# Patient Record
Sex: Female | Born: 1965 | Race: Black or African American | Hispanic: No | Marital: Single | Smoking: Never smoker
Health system: Southern US, Community
[De-identification: ages and names within clinical notes are randomized; demographics above are authoritative.]

## PROBLEM LIST (undated history)

## (undated) ENCOUNTER — Ambulatory Visit (HOSPITAL_COMMUNITY): Admission: EM | Payer: Managed Care, Other (non HMO) | Source: Home / Self Care

## (undated) DIAGNOSIS — I10 Essential (primary) hypertension: Secondary | ICD-10-CM

## (undated) DIAGNOSIS — F419 Anxiety disorder, unspecified: Secondary | ICD-10-CM

## (undated) DIAGNOSIS — N189 Chronic kidney disease, unspecified: Secondary | ICD-10-CM

## (undated) DIAGNOSIS — Z87442 Personal history of urinary calculi: Secondary | ICD-10-CM

## (undated) DIAGNOSIS — N23 Unspecified renal colic: Secondary | ICD-10-CM

## (undated) DIAGNOSIS — K219 Gastro-esophageal reflux disease without esophagitis: Secondary | ICD-10-CM

## (undated) DIAGNOSIS — I499 Cardiac arrhythmia, unspecified: Secondary | ICD-10-CM

## (undated) DIAGNOSIS — R030 Elevated blood-pressure reading, without diagnosis of hypertension: Secondary | ICD-10-CM

## (undated) DIAGNOSIS — T7840XA Allergy, unspecified, initial encounter: Secondary | ICD-10-CM

## (undated) HISTORY — DX: Elevated blood-pressure reading, without diagnosis of hypertension: R03.0

## (undated) HISTORY — DX: Personal history of urinary calculi: Z87.442

## (undated) HISTORY — DX: Anxiety disorder, unspecified: F41.9

## (undated) HISTORY — DX: Unspecified renal colic: N23

## (undated) HISTORY — DX: Cardiac arrhythmia, unspecified: I49.9

## (undated) HISTORY — DX: Gastro-esophageal reflux disease without esophagitis: K21.9

## (undated) HISTORY — PX: GYNECOLOGIC CRYOSURGERY: SHX857

## (undated) HISTORY — PX: DENTAL SURGERY: SHX609

## (undated) HISTORY — DX: Essential (primary) hypertension: I10

## (undated) HISTORY — DX: Allergy, unspecified, initial encounter: T78.40XA

## (undated) HISTORY — PX: COLPOSCOPY: SHX161

---
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
## 2001-10-26 ENCOUNTER — Other Ambulatory Visit: Admission: RE | Admit: 2001-10-26 | Discharge: 2001-10-26 | Payer: Self-pay | Admitting: Obstetrics and Gynecology

## 2002-11-05 ENCOUNTER — Other Ambulatory Visit: Admission: RE | Admit: 2002-11-05 | Discharge: 2002-11-05 | Payer: Self-pay | Admitting: Obstetrics and Gynecology

## 2003-11-06 ENCOUNTER — Other Ambulatory Visit: Admission: RE | Admit: 2003-11-06 | Discharge: 2003-11-06 | Payer: Self-pay | Admitting: Obstetrics and Gynecology

## 2004-09-21 ENCOUNTER — Ambulatory Visit: Payer: Self-pay | Admitting: Internal Medicine

## 2004-09-28 ENCOUNTER — Ambulatory Visit: Payer: Self-pay | Admitting: Internal Medicine

## 2004-11-12 ENCOUNTER — Other Ambulatory Visit: Admission: RE | Admit: 2004-11-12 | Discharge: 2004-11-12 | Payer: Self-pay | Admitting: Obstetrics and Gynecology

## 2005-11-18 ENCOUNTER — Other Ambulatory Visit: Admission: RE | Admit: 2005-11-18 | Discharge: 2005-11-18 | Payer: Self-pay | Admitting: Obstetrics and Gynecology

## 2006-03-17 ENCOUNTER — Ambulatory Visit: Payer: Self-pay | Admitting: Internal Medicine

## 2006-10-19 DIAGNOSIS — F329 Major depressive disorder, single episode, unspecified: Secondary | ICD-10-CM | POA: Insufficient documentation

## 2006-10-19 DIAGNOSIS — F3289 Other specified depressive episodes: Secondary | ICD-10-CM | POA: Insufficient documentation

## 2006-12-08 ENCOUNTER — Other Ambulatory Visit: Admission: RE | Admit: 2006-12-08 | Discharge: 2006-12-08 | Payer: Self-pay | Admitting: Obstetrics and Gynecology

## 2007-01-10 ENCOUNTER — Telehealth: Payer: Self-pay | Admitting: Internal Medicine

## 2007-12-21 ENCOUNTER — Ambulatory Visit: Payer: Self-pay | Admitting: Internal Medicine

## 2007-12-21 DIAGNOSIS — Z87442 Personal history of urinary calculi: Secondary | ICD-10-CM | POA: Insufficient documentation

## 2007-12-21 DIAGNOSIS — N23 Unspecified renal colic: Secondary | ICD-10-CM | POA: Insufficient documentation

## 2007-12-21 HISTORY — DX: Personal history of urinary calculi: Z87.442

## 2007-12-21 LAB — CONVERTED CEMR LAB
Ketones, urine, test strip: NEGATIVE
Nitrite: NEGATIVE
Urobilinogen, UA: NEGATIVE
WBC Urine, dipstick: NEGATIVE

## 2008-01-01 ENCOUNTER — Other Ambulatory Visit: Admission: RE | Admit: 2008-01-01 | Discharge: 2008-01-01 | Payer: Self-pay | Admitting: Obstetrics and Gynecology

## 2008-01-01 ENCOUNTER — Ambulatory Visit: Payer: Self-pay | Admitting: Obstetrics and Gynecology

## 2008-01-01 ENCOUNTER — Encounter: Payer: Self-pay | Admitting: Obstetrics and Gynecology

## 2008-07-17 ENCOUNTER — Ambulatory Visit: Payer: Self-pay | Admitting: Internal Medicine

## 2008-07-17 LAB — CONVERTED CEMR LAB
ALT: 14 units/L (ref 0–35)
BUN: 10 mg/dL (ref 6–23)
Bilirubin Urine: NEGATIVE
Bilirubin, Direct: 0 mg/dL (ref 0.0–0.3)
Calcium: 9 mg/dL (ref 8.4–10.5)
Chloride: 110 meq/L (ref 96–112)
Cholesterol: 149 mg/dL (ref 0–200)
Creatinine, Ser: 0.5 mg/dL (ref 0.4–1.2)
Eosinophils Relative: 0.8 % (ref 0.0–5.0)
GFR calc non Af Amer: 175.27 mL/min (ref >60)
HDL: 51.3 mg/dL (ref >39.00)
Ketones, urine, test strip: NEGATIVE
LDL Cholesterol: 81 mg/dL (ref 0–99)
MCV: 86.5 fL (ref 78.0–100.0)
Monocytes Absolute: 0.2 10*3/uL (ref 0.1–1.0)
Neutrophils Relative %: 68.5 % (ref 43.0–77.0)
Platelets: 337 10*3/uL (ref 150.0–400.0)
Protein, U semiquant: NEGATIVE
Total Bilirubin: 0.5 mg/dL (ref 0.3–1.2)
Triglycerides: 86 mg/dL (ref 0.0–149.0)
Urobilinogen, UA: 0.2
VLDL: 17.2 mg/dL (ref 0.0–40.0)
WBC: 8.6 10*3/uL (ref 4.5–10.5)

## 2008-07-29 ENCOUNTER — Ambulatory Visit: Payer: Self-pay | Admitting: Internal Medicine

## 2008-12-23 ENCOUNTER — Encounter: Admission: RE | Admit: 2008-12-23 | Discharge: 2008-12-23 | Payer: Self-pay | Admitting: Obstetrics and Gynecology

## 2009-01-06 ENCOUNTER — Other Ambulatory Visit: Admission: RE | Admit: 2009-01-06 | Discharge: 2009-01-06 | Payer: Self-pay | Admitting: Obstetrics and Gynecology

## 2009-01-06 ENCOUNTER — Ambulatory Visit: Payer: Self-pay | Admitting: Obstetrics and Gynecology

## 2009-01-06 ENCOUNTER — Encounter: Payer: Self-pay | Admitting: Obstetrics and Gynecology

## 2009-10-14 ENCOUNTER — Ambulatory Visit: Payer: Self-pay | Admitting: Family Medicine

## 2009-10-14 DIAGNOSIS — R300 Dysuria: Secondary | ICD-10-CM | POA: Insufficient documentation

## 2009-10-14 DIAGNOSIS — R030 Elevated blood-pressure reading, without diagnosis of hypertension: Secondary | ICD-10-CM

## 2009-10-14 HISTORY — DX: Elevated blood-pressure reading, without diagnosis of hypertension: R03.0

## 2009-10-14 LAB — CONVERTED CEMR LAB
Bilirubin Urine: NEGATIVE
Ketones, urine, test strip: NEGATIVE

## 2009-10-28 ENCOUNTER — Ambulatory Visit: Payer: Self-pay | Admitting: Internal Medicine

## 2009-10-28 DIAGNOSIS — I10 Essential (primary) hypertension: Secondary | ICD-10-CM | POA: Insufficient documentation

## 2010-01-06 ENCOUNTER — Encounter: Admission: RE | Admit: 2010-01-06 | Discharge: 2010-01-06 | Payer: Self-pay | Admitting: Obstetrics and Gynecology

## 2010-04-07 NOTE — Assessment & Plan Note (Signed)
Summary: ?uti/discomfort when urinating/cjr   Vital Signs:  Patient profile:   45 year old female Height:      64.5 inches Weight:      233 pounds BMI:     39.52 Temp:     98.7 degrees F oral BP sitting:   132 / 100  (left arm) Cuff size:   regular  Vitals Entered By: Kern Reap CMA Duncan Dull) (October 14, 2009 3:39 PM) CC: uti Is Patient Diabetic? No   CC:  uti.  History of Present Illness: Tanya Richard is a 45 year old female, who comes in today for evaluation of two problems.  Five days ago she began having dysuria.  She comes in today, because he's been drinking lots of water, and it won't go away.  Her last UTI was 3 years ago.  She's had no fever, chills, or back pain.  She is on BCPs.  Her occupation is since she sits for 6 to 7 hours at a time and does not get up to empty her bladder.  A BP today by me 170 over hundred right arm sitting position.  I wish that she had a hurry to get here and time and this ended the day and is a lot of stress at work.  No family history of hypertension.  Allergies: No Known Drug Allergies  Family History: Reviewed history from 07/29/2008 and no changes required. Family History of Arthritis Family History Diabetes 1st degree relative Family History Hypertension Mother Age 69  IGT Father Age 36- healthy  2 half -sisters  Social History: Reviewed history from 07/29/2008 and no changes required. Single Never Smoked Alcohol use-no Drug use-no Regular exercise-yes  Review of Systems      See HPI  Physical Exam  General:  Well-developed,well-nourished,in no acute distress; alert,appropriate and cooperative throughout examination Heart:  170 over hundred right arm sitting position Abdomen:  Bowel sounds positive,abdomen soft and non-tender without masses, organomegaly or hernias noted.   Impression & Recommendations:  Problem # 1:  DYSURIA (ICD-788.1) Assessment New  Orders: UA Dipstick w/o Micro (manual) (46962) Prescription  Created Electronically [phone])  Her updated medication list for this problem includes:    Septra Ds 800-160 Mg Tabs (Sulfamethoxazole-trimethoprim) .Marland Kitchen... Take 1 tablet by mouth two times a day  Problem # 2:  ELEVATED BP READING WITHOUT DX HYPERTENSION (ICD-796.2) Assessment: New  Orders: Prescription Created Electronically [phone])  Complete Medication List: 1)  Ortho Tri-cyclen (28) 0.035 Mg Tabs (Norgestimate-ethinyl estradiol) .Marland Kitchen.. 1 once daily 2)  Doxycycline  3)  Daily Value Multivitamin Tabs (Multiple vitamin) .... Once daily 4)  Naproxen Sodium 220 Mg Tabs (Naproxen sodium) .... As needed 5)  Septra Ds 800-160 Mg Tabs (Sulfamethoxazole-trimethoprim) .... Take 1 tablet by mouth two times a day  Patient Instructions: 1)  drink 30 ounces of water daily and be sure to empty her bladder every 3 hours during the day.  Begin Septra, one twice a day for 10 days. 2)  Purchase.  A digital  pressure cuff, check the BP morning, and return to see Dr. Kirtland Bouchard  in two weeks for follow-up with the data and the device Prescriptions: SEPTRA DS 800-160 MG TABS (SULFAMETHOXAZOLE-TRIMETHOPRIM) Take 1 tablet by mouth two times a day  #20 x 1   Entered and Authorized by:   Roderick Pee MD   Signed by:   Roderick Pee MD on 10/14/2009   Method used:   Electronically to        [id number][facility] (retail)       [address] [place], [place]  [address]       Ph: [phone]       Fax: [phone]   RxID:   [id number]   Laboratory Results   Urine Tests  Date/Time Received: October 14, 2009   Routine Urinalysis   Color: yellow Appearance: Cloudy Glucose: negative   (Normal Range: Negative) Bilirubin: negative   (Normal Range: Negative) Ketone: negative   (Normal Range: Negative) Spec. Gravity: 1.015   (Normal Range: 1.003-1.035) Blood: moderate ++   (Normal Range: Negative) pH: 6.5   (Normal Range: 5.0-8.0) Protein: trace   (Normal Range: Negative) Urobilinogen: 0.2    (Normal Range: 0-1) Nitrite: negative   (Normal Range: Negative) Leukocyte Esterace: moderate ++   (Normal Range: Negative)    Comments: Kathrynn Speed CMA  October 14, 2009 3:55 PM

## 2010-04-07 NOTE — Assessment & Plan Note (Signed)
Summary: 2 wk rov/njr   Vital Signs:  Patient profile:   45 year old female Weight:      234 pounds BP sitting:   140 / 100  (left arm) Cuff size:   large  Vitals Entered By: Kathrynn Speed CMA (October 28, 2009 4:29 PM) CC: 2 week fu BP, src Is Patient Diabetic? No   CC:  2 week fu BP and src.  History of Present Illness: 45 year old patient who is seen today for follow-up of hypertension.  She was seen two weeks ago and treated for a UTI.  These  symptoms have resolved.  she was noted to have stage II hypertension at that time.  She has a monitoring home blood pressure readings that have been consistently high.  She was last seen here in the spring and was normotensive at that time.  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 1 Once Daily 2)  Doxycycline 3)  Daily Value Multivitamin  Tabs (Multiple Vitamin) .... Once Daily 4)  Naproxen Sodium 220 Mg Tabs (Naproxen Sodium) .... As Needed 5)  Septra Ds 800-160 Mg Tabs (Sulfamethoxazole-Trimethoprim) .... Take 1 Tablet By Mouth Two Times A Day  Allergies (verified): No Known Drug Allergies  Past History:  Past Medical History: Depression- situational Vertigo Nephrolithiasis, hx of Hypertension  Physical Exam  General:  overweight-appearing.  140/100   Impression & Recommendations:  Problem # 1:  HYPERTENSION (ICD-401.9)  Her updated medication list for this problem includes:    Lisinopril-hydrochlorothiazide 20-25 Mg Tabs (Lisinopril-hydrochlorothiazide) ..... One daily  Complete Medication List: 1)  Ortho Tri-cyclen (28) 0.035 Mg Tabs (Norgestimate-ethinyl estradiol) .Marland Kitchen.. 1 once daily 2)  Doxycycline  3)  Daily Value Multivitamin Tabs (Multiple vitamin) .... Once daily 4)  Naproxen Sodium 220 Mg Tabs (Naproxen sodium) .... As needed 5)  Septra Ds 800-160 Mg Tabs (Sulfamethoxazole-trimethoprim) .... Take 1 tablet by  mouth two times a day 6)  Lisinopril-hydrochlorothiazide 20-25 Mg Tabs (Lisinopril-hydrochlorothiazide) .... One daily  Patient Instructions: 1)  Limit your Sodium (Salt). 2)  It is important that you exercise regularly at least 20 minutes 5 times a week. If you develop chest pain, have severe difficulty breathing, or feel very tired , stop exercising immediately and seek medical attention. 3)  You need to lose weight. Consider a lower calorie diet and regular exercise.  4)  Check your Blood Pressure regularly.  5)  Please schedule a follow-up appointment in 1 month. Prescriptions: LISINOPRIL-HYDROCHLOROTHIAZIDE 20-25 MG TABS (LISINOPRIL-HYDROCHLOROTHIAZIDE) one daily  #90 x 0   Entered and Authorized by:   Gordy Savers  MD   Signed by:   Gordy Savers  MD on 10/28/2009   Method used:   Electronically to        [facility] [phone]* (retail)       [address] [place], [place]  [address]       Ph: [phone]       Fax: [phone]   RxID:   [id number]

## 2010-04-30 ENCOUNTER — Other Ambulatory Visit: Payer: Self-pay | Admitting: Obstetrics and Gynecology

## 2010-04-30 ENCOUNTER — Encounter (INDEPENDENT_AMBULATORY_CARE_PROVIDER_SITE_OTHER): Payer: Managed Care, Other (non HMO) | Admitting: Obstetrics and Gynecology

## 2010-04-30 ENCOUNTER — Other Ambulatory Visit (HOSPITAL_COMMUNITY)
Admission: RE | Admit: 2010-04-30 | Discharge: 2010-04-30 | Disposition: A | Discharge status: Home / Self Care | Payer: Managed Care, Other (non HMO) | Source: Ambulatory Visit | Attending: Obstetrics and Gynecology | Admitting: Obstetrics and Gynecology

## 2010-04-30 DIAGNOSIS — R823 Hemoglobinuria: Secondary | ICD-10-CM

## 2010-04-30 DIAGNOSIS — Z124 Encounter for screening for malignant neoplasm of cervix: Secondary | ICD-10-CM | POA: Insufficient documentation

## 2010-04-30 DIAGNOSIS — Z01419 Encounter for gynecological examination (general) (routine) without abnormal findings: Secondary | ICD-10-CM

## 2010-11-05 ENCOUNTER — Other Ambulatory Visit (INDEPENDENT_AMBULATORY_CARE_PROVIDER_SITE_OTHER): Payer: Managed Care, Other (non HMO)

## 2010-11-05 DIAGNOSIS — Z0000 Encounter for general adult medical examination without abnormal findings: Secondary | ICD-10-CM

## 2010-11-05 LAB — CBC WITH DIFFERENTIAL/PLATELET
Basophils Relative: 0.4 % (ref 0.0–3.0)
Eosinophils Absolute: 0.1 10*3/uL (ref 0.0–0.7)
HCT: 37 % (ref 36.0–46.0)
Hemoglobin: 12.1 g/dL (ref 12.0–15.0)
MCHC: 32.7 g/dL (ref 30.0–36.0)
MCV: 88.2 fl (ref 78.0–100.0)
Monocytes Absolute: 0.3 10*3/uL (ref 0.1–1.0)
Neutro Abs: 5.6 10*3/uL (ref 1.4–7.7)
RBC: 4.2 Mil/uL (ref 3.87–5.11)

## 2010-11-05 LAB — HEPATIC FUNCTION PANEL
ALT: 14 U/L (ref 0–35)
Albumin: 3.6 g/dL (ref 3.5–5.2)
Alkaline Phosphatase: 103 U/L (ref 39–117)
Total Protein: 7.6 g/dL (ref 6.0–8.3)

## 2010-11-05 LAB — LIPID PANEL
HDL: 63.6 mg/dL (ref >39.00)
Triglycerides: 56 mg/dL (ref 0.0–149.0)

## 2010-11-05 LAB — POCT URINALYSIS DIPSTICK
Glucose, UA: NEGATIVE
pH, UA: 6

## 2010-11-05 LAB — T4, FREE: Free T4: 1.11 ng/dL (ref 0.60–1.60)

## 2010-11-05 LAB — BASIC METABOLIC PANEL
CO2: 27 mEq/L (ref 19–32)
Chloride: 105 mEq/L (ref 96–112)
Potassium: 3.5 mEq/L (ref 3.5–5.1)
Sodium: 141 mEq/L (ref 135–145)

## 2010-11-05 LAB — T3, FREE: T3, Free: 3.1 pg/mL (ref 2.3–4.2)

## 2010-11-06 LAB — VITAMIN D 25 HYDROXY (VIT D DEFICIENCY, FRACTURES): Vit D, 25-Hydroxy: 42 ng/mL (ref 30–89)

## 2010-11-11 ENCOUNTER — Encounter: Payer: Self-pay | Admitting: Internal Medicine

## 2010-11-13 ENCOUNTER — Encounter: Payer: Self-pay | Admitting: Internal Medicine

## 2010-11-13 ENCOUNTER — Ambulatory Visit (INDEPENDENT_AMBULATORY_CARE_PROVIDER_SITE_OTHER): Payer: Managed Care, Other (non HMO) | Admitting: Internal Medicine

## 2010-11-13 ENCOUNTER — Encounter: Payer: Managed Care, Other (non HMO) | Admitting: Internal Medicine

## 2010-11-13 DIAGNOSIS — Z23 Encounter for immunization: Secondary | ICD-10-CM

## 2010-11-13 DIAGNOSIS — Z0000 Encounter for general adult medical examination without abnormal findings: Secondary | ICD-10-CM

## 2010-11-13 DIAGNOSIS — I10 Essential (primary) hypertension: Secondary | ICD-10-CM

## 2010-11-13 MED ORDER — LISINOPRIL-HYDROCHLOROTHIAZIDE 20-25 MG PO TABS: 1.0000 | ORAL_TABLET | Freq: Every day | ORAL | Status: DC

## 2010-11-13 NOTE — Patient Instructions (Signed)
Limit your sodium (Salt) intake    It is important that you exercise regularly, at least 20 minutes 3 to 4 times per week.  If you develop chest pain or shortness of breath seek  medical attention.  Please check your blood pressure on a regular basis.  If it is consistently greater than 150/90, please make an office appointment.  Return in one year for follow-up  

## 2010-11-13 NOTE — Progress Notes (Signed)
Subjective:    Patient ID: Tanya Richard, female    DOB: 12-Dec-1965, 45 y.o.   MRN: [id number]  HPI  45 year old patient who is seen today for an annual physical. She was seen by gynecology in February. Due to some mild hair loss she has held her blood pressure medicine for the past 2 weeks. She is still somewhat frustrated by lack of weight loss she is exercising regularly with a Systems analyst. Her biggest fall perhaps is drinking considerable sweet tea throughout the day.  Problems Prior to Update:   1) Renal Colic (ICD-788.0)  2) Nephrolithiasis, Hx of (ICD-V13.01)  3) Depression (ICD-311)  4) Family History Diabetes 1st Degree Relative (ICD-V18.0)   Medications Prior to Update:  1) Ortho Tri-Cyclen (28) 0.035 Mg Tabs (Norgestimate-Ethinyl Estradiol) .Marland Kitchen.. 1 Once Daily  2) Hydrocodone-Acetaminophen 7.5-750 Mg Tabs (Hydrocodone-Acetaminophen) .... One Every 6 Hours As Needed For Pain   Allergies:  No Known Drug Allergies  Past History:  Past Medical History:  Depression- situational  Vertigo  Nephrolithiasis, hx of   Family History:  Reviewed history from 10/19/2006 and no changes required:  Family History of Arthritis  Family History Diabetes 1st degree relative  Family History Hypertension  Mother Age 49 IGT Myasthenia Gravis Father Age 106- healthy  2 half -sisters   Social History:  Reviewed history from 10/19/2006 and no changes required:  Single  Never Smoked  Alcohol use-no  Drug use-no  Regular exercise-yes    Review of Systems  Constitutional: Negative for fever, appetite change, fatigue and unexpected weight change.  HENT: Negative for hearing loss, ear pain, nosebleeds, congestion, sore throat, mouth sores, trouble swallowing, neck stiffness, dental problem, voice change, sinus pressure and tinnitus.   Eyes: Negative for photophobia, pain, redness and visual disturbance.  Respiratory: Negative for cough, chest tightness and shortness of breath.     Cardiovascular: Negative for chest pain, palpitations and leg swelling.  Gastrointestinal: Negative for nausea, vomiting, abdominal pain, diarrhea, constipation, blood in stool, abdominal distention and rectal pain.  Genitourinary: Negative for dysuria, urgency, frequency, hematuria, flank pain, vaginal bleeding, vaginal discharge, difficulty urinating, genital sores, vaginal pain, menstrual problem and pelvic pain.  Musculoskeletal: Negative for back pain and arthralgias.  Skin: Negative for rash.  Neurological: Negative for dizziness, syncope, speech difficulty, weakness, light-headedness, numbness and headaches.  Hematological: Negative for adenopathy. Does not bruise/bleed easily.  Psychiatric/Behavioral: Negative for suicidal ideas, behavioral problems, self-injury, dysphoric mood and agitation. The patient is not nervous/anxious.        Objective:   Physical Exam  Constitutional: She is oriented to person, place, and time. She appears well-developed and well-nourished.  HENT:  Head: Normocephalic.  Right Ear: External ear normal.  Left Ear: External ear normal.  Mouth/Throat: Oropharynx is clear and moist.  Eyes: Conjunctivae and EOM are normal. Pupils are equal, round, and reactive to light.  Neck: Normal range of motion. Neck supple. No thyromegaly present.  Cardiovascular: Normal rate, regular rhythm, normal heart sounds and intact distal pulses.   Pulmonary/Chest: Effort normal and breath sounds normal.  Abdominal: Soft. Bowel sounds are normal. She exhibits no mass. There is no tenderness.  Musculoskeletal: Normal range of motion.  Lymphadenopathy:    She has no cervical adenopathy.  Neurological: She is alert and oriented to person, place, and time.  Skin: Skin is warm and dry. No rash noted.  Psychiatric: She has a normal mood and affect. Her behavior is normal.          Assessment &  Plan:   Preventive health examination Exogenous obesity. Dietary issues  discussed she'll continue her vigorous exercise regimen Hypertension. The patient will resume her blood pressure medication and continue home blood pressure monitoring. She'll call the blood pressure is in excess of 140/90 consistently

## 2010-12-29 ENCOUNTER — Other Ambulatory Visit: Payer: Self-pay | Admitting: Obstetrics and Gynecology

## 2010-12-29 DIAGNOSIS — Z1231 Encounter for screening mammogram for malignant neoplasm of breast: Secondary | ICD-10-CM

## 2011-01-18 ENCOUNTER — Ambulatory Visit
Admission: RE | Admit: 2011-01-18 | Discharge: 2011-01-18 | Disposition: A | Discharge status: Home / Self Care | Payer: Managed Care, Other (non HMO) | Source: Ambulatory Visit | Attending: Obstetrics and Gynecology | Admitting: Obstetrics and Gynecology

## 2011-01-18 DIAGNOSIS — Z1231 Encounter for screening mammogram for malignant neoplasm of breast: Secondary | ICD-10-CM

## 2011-06-03 ENCOUNTER — Other Ambulatory Visit: Payer: Self-pay | Admitting: Obstetrics and Gynecology

## 2011-06-03 DIAGNOSIS — N870 Mild cervical dysplasia: Secondary | ICD-10-CM | POA: Insufficient documentation

## 2011-06-14 ENCOUNTER — Ambulatory Visit (INDEPENDENT_AMBULATORY_CARE_PROVIDER_SITE_OTHER): Payer: Managed Care, Other (non HMO) | Admitting: Obstetrics and Gynecology

## 2011-06-14 ENCOUNTER — Encounter: Payer: Self-pay | Admitting: Obstetrics and Gynecology

## 2011-06-14 ENCOUNTER — Other Ambulatory Visit (HOSPITAL_COMMUNITY)
Admission: RE | Admit: 2011-06-14 | Discharge: 2011-06-14 | Disposition: A | Discharge status: Home / Self Care | Payer: Managed Care, Other (non HMO) | Source: Ambulatory Visit | Attending: Obstetrics and Gynecology | Admitting: Obstetrics and Gynecology

## 2011-06-14 VITALS — BP 140/86 | Ht 64.0 in | Wt 218.0 lb

## 2011-06-14 DIAGNOSIS — Z01419 Encounter for gynecological examination (general) (routine) without abnormal findings: Secondary | ICD-10-CM | POA: Insufficient documentation

## 2011-06-14 DIAGNOSIS — N898 Other specified noninflammatory disorders of vagina: Secondary | ICD-10-CM

## 2011-06-14 LAB — WET PREP FOR TRICH, YEAST, CLUE
Trich, Wet Prep: NONE SEEN
Yeast Wet Prep HPF POC: NONE SEEN

## 2011-06-14 MED ORDER — ORTHO TRI-CYCLEN (28) 0.18/0.215/0.25 MG-35 MCG PO TABS: 1.0000 | ORAL_TABLET | Freq: Every day | ORAL | Status: DC

## 2011-06-14 MED ORDER — KETOCONAZOLE 200 MG PO TABS: 200.0000 mg | ORAL_TABLET | Freq: Two times a day (BID) | ORAL | Status: AC

## 2011-06-14 NOTE — Progress Notes (Signed)
The patient came to see me today for her annual GYN exam. She is having regular cycles on her birth control pills. She has some hypertension which has been treated and they still are adjusting her dose. She is up-to-date on mammograms. She is having no unusual bleeding. She is having no pelvic pain. She is up-to-date on mammograms. She is complaining of a vaginal discharge without itching or odor. It is white. Over the years she has had trouble with recurrent yeast vaginitis. She is not diabetic.  Physical examination: HEENT within normal limits. Neck: Thyroid not large. No masses. Supraclavicular nodes: not enlarged. Breasts: Examined in both sitting and lying  position. No skin changes and no masses. Abdomen: Soft no guarding rebound or masses or hernia. Pelvic: External: Within normal limits. BUS: Within normal limits. Vaginal:within normal limits. Good estrogen effect. No evidence of cystocele rectocele or enterocele. Cervix: clean. Uterus: Normal size and shape. Adnexa: No masses. Rectovaginal exam: Confirmatory and negative. Extremities: Within normal limits. Wet prep negative for yeast.  Assessment: #1. Hypertension #2. Recurrent vaginitis  Plan: We discussed other options for birth control including Mirena IUD. She is a nonsmoker and although I would favor that over pills because of her high blood pressure I told her I would continue to prescribe the pill as long as she remained normotensive. I suspect this is yeast vaginitis in spite of the negative wet prep since it always presents this way and responds to medication. She was treated today with Nizoral 200 mg twice a day for week and then she will start maintenance with oral refresh. Lab through her PCP.

## 2011-06-15 LAB — URINALYSIS W MICROSCOPIC + REFLEX CULTURE
Bilirubin Urine: NEGATIVE
Ketones, ur: NEGATIVE mg/dL
Nitrite: NEGATIVE
Protein, ur: NEGATIVE mg/dL
Specific Gravity, Urine: 1.018 (ref 1.005–1.030)
Urobilinogen, UA: 0.2 mg/dL (ref 0.0–1.0)

## 2011-06-17 ENCOUNTER — Other Ambulatory Visit: Payer: Self-pay | Admitting: *Deleted

## 2011-06-17 DIAGNOSIS — N390 Urinary tract infection, site not specified: Secondary | ICD-10-CM

## 2011-06-17 MED ORDER — NITROFURANTOIN MONOHYD MACRO 100 MG PO CAPS: 100.0000 mg | ORAL_CAPSULE | Freq: Two times a day (BID) | ORAL | Status: AC

## 2011-07-20 ENCOUNTER — Other Ambulatory Visit: Payer: Self-pay | Admitting: Obstetrics and Gynecology

## 2011-07-20 ENCOUNTER — Other Ambulatory Visit: Payer: Self-pay | Admitting: *Deleted

## 2011-07-20 DIAGNOSIS — I10 Essential (primary) hypertension: Secondary | ICD-10-CM

## 2011-07-20 MED ORDER — LISINOPRIL-HYDROCHLOROTHIAZIDE 20-25 MG PO TABS: 1.0000 | ORAL_TABLET | Freq: Every day | ORAL | Status: DC

## 2011-07-20 NOTE — Telephone Encounter (Signed)
[facility]

## 2011-07-20 NOTE — Telephone Encounter (Signed)
Ok to refill. Give 3 refills.

## 2011-11-30 ENCOUNTER — Other Ambulatory Visit: Payer: Self-pay | Admitting: Internal Medicine

## 2011-12-29 ENCOUNTER — Other Ambulatory Visit: Payer: Self-pay | Admitting: Obstetrics and Gynecology

## 2011-12-29 ENCOUNTER — Other Ambulatory Visit: Payer: Self-pay | Admitting: Gynecology

## 2011-12-29 DIAGNOSIS — Z1231 Encounter for screening mammogram for malignant neoplasm of breast: Secondary | ICD-10-CM

## 2012-01-24 ENCOUNTER — Ambulatory Visit
Admission: RE | Admit: 2012-01-24 | Discharge: 2012-01-24 | Disposition: A | Discharge status: Home / Self Care | Payer: Managed Care, Other (non HMO) | Source: Ambulatory Visit | Attending: Obstetrics and Gynecology | Admitting: Obstetrics and Gynecology

## 2012-01-24 ENCOUNTER — Ambulatory Visit: Payer: Managed Care, Other (non HMO)

## 2012-01-24 DIAGNOSIS — Z1231 Encounter for screening mammogram for malignant neoplasm of breast: Secondary | ICD-10-CM

## 2012-02-01 ENCOUNTER — Other Ambulatory Visit: Payer: Self-pay | Admitting: *Deleted

## 2012-02-01 DIAGNOSIS — N63 Unspecified lump in unspecified breast: Secondary | ICD-10-CM

## 2012-02-01 DIAGNOSIS — R928 Other abnormal and inconclusive findings on diagnostic imaging of breast: Secondary | ICD-10-CM

## 2012-02-11 ENCOUNTER — Ambulatory Visit
Admission: RE | Admit: 2012-02-11 | Discharge: 2012-02-11 | Disposition: A | Discharge status: Home / Self Care | Payer: Managed Care, Other (non HMO) | Source: Ambulatory Visit | Attending: Obstetrics and Gynecology | Admitting: Obstetrics and Gynecology

## 2012-02-11 DIAGNOSIS — N63 Unspecified lump in unspecified breast: Secondary | ICD-10-CM

## 2012-02-11 DIAGNOSIS — R928 Other abnormal and inconclusive findings on diagnostic imaging of breast: Secondary | ICD-10-CM

## 2012-02-18 ENCOUNTER — Other Ambulatory Visit: Payer: Self-pay | Admitting: Internal Medicine

## 2012-02-28 ENCOUNTER — Encounter: Payer: Self-pay | Admitting: Internal Medicine

## 2012-02-28 ENCOUNTER — Ambulatory Visit (INDEPENDENT_AMBULATORY_CARE_PROVIDER_SITE_OTHER): Payer: Managed Care, Other (non HMO) | Admitting: Internal Medicine

## 2012-02-28 VITALS — BP 122/90 | HR 88 | Temp 98.3°F | Resp 20 | Wt 220.0 lb

## 2012-02-28 DIAGNOSIS — L919 Hypertrophic disorder of the skin, unspecified: Secondary | ICD-10-CM

## 2012-02-28 DIAGNOSIS — L909 Atrophic disorder of skin, unspecified: Secondary | ICD-10-CM

## 2012-02-28 DIAGNOSIS — L918 Other hypertrophic disorders of the skin: Secondary | ICD-10-CM

## 2012-02-28 DIAGNOSIS — I10 Essential (primary) hypertension: Secondary | ICD-10-CM

## 2012-02-28 MED ORDER — LISINOPRIL-HYDROCHLOROTHIAZIDE 20-25 MG PO TABS: 1.0000 | ORAL_TABLET | Freq: Every day | ORAL | Status: DC

## 2012-02-28 NOTE — Progress Notes (Signed)
Subjective:    Patient ID: Tanya Richard, female    DOB: Mar 03, 1966, 46 y.o.   MRN: [id number]  HPI  46 year old patient who is seen today for followup of hypertension. She has been checking the blood pressure readings with nice results. She remains on combination lisinopril hydrochlorothiazide which he continues to tolerate well. She has a history of obesity and does have a personal trainer and exercises 3 times weekly. She states she plans on tightening up on her diet after the first of the year. She states that she has lost inches but not pounds. She has a large skin tag involvement left upper inner thigh and requests removal today  Past Medical History  Diagnosis Date  . ELEVATED BP READING WITHOUT DX HYPERTENSION 10/14/2009  . HYPERTENSION 10/28/2009  . NEPHROLITHIASIS, HX OF 12/21/2007  . Renal colic 12/21/2007  . DEPRESSION 10/19/2006  . 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 Marital Status: Single    Spouse Name: N/A    Number of Children: N/A  . Years of Education: N/A   Occupational History  . Not on file.   Social History Main Topics  . Smoking status: Never Smoker   . Smokeless tobacco: Never Used  . Alcohol Use: Yes     Comment: rare  . Drug Use: No  . Sexually Active: Yes    Birth Control/ Protection: Pill   Other Topics Concern  . Not on file   Social History Narrative  . No narrative on file    Past Surgical History  Procedure Date  . Mouth surgery 1984  . Colposcopy   . Gynecologic cryosurgery     Family History  Problem Relation Age of Onset  . Arthritis Neg Hx     family hx  . Diabetes Mother   . Diabetes Maternal Aunt   . Hypertension Maternal Grandmother   . Cancer Maternal Grandmother     MGGM-Uterine cancer    No Known Allergies  Current Outpatient Prescriptions on File Prior to Visit  Medication Sig Dispense Refill  . ketoconazole (NIZORAL) 200 MG tablet  Take 1 tablet (200 mg total) by mouth 2 (two) times daily.  14 tablet  0  . lisinopril-hydrochlorothiazide (PRINZIDE,ZESTORETIC) 20-25 MG per tablet Take 1 tablet by mouth daily.  30 tablet  3  . lisinopril-hydrochlorothiazide (PRINZIDE,ZESTORETIC) 20-25 MG per tablet TAKE 1 TABLET BY MOUTH DAILY.  60 tablet  0  . loratadine (CLARITIN) 10 MG tablet Take 10 mg by mouth daily.        . Multiple Vitamin (MULTIVITAMIN) capsule Take 1 capsule by mouth daily.        . naproxen sodium (ANAPROX) 220 MG tablet Take 220 mg by mouth as needed.        . naproxen sodium (ANAPROX) 550 MG tablet TAKE 1 TABLET 3 TIMES A DAY  30 tablet  3  . ORTHO TRI-CYCLEN, 28, 0.18/0.215/0.25 MG-35 MCG tablet Take 1 tablet by mouth daily.  28 tablet  14    BP 122/90  Pulse 88  Temp 98.3 F (36.8 C) (Oral)  Resp 20  Wt 220 lb (99.791 kg)  LMP 02/13/2012       Review of Systems  Constitutional: Negative.   HENT: Negative for hearing loss, congestion, sore throat, rhinorrhea, dental problem, sinus pressure and tinnitus.   Eyes: Negative for pain, discharge and visual disturbance.  Respiratory: Negative for cough and  shortness of breath.   Cardiovascular: Negative for chest pain, palpitations and leg swelling.  Gastrointestinal: Negative for nausea, vomiting, abdominal pain, diarrhea, constipation, blood in stool and abdominal distention.  Genitourinary: Negative for dysuria, urgency, frequency, hematuria, flank pain, vaginal bleeding, vaginal discharge, difficulty urinating, vaginal pain and pelvic pain.  Musculoskeletal: Negative for joint swelling, arthralgias and gait problem.  Skin: Negative for rash.       Skin tag left upper inner thigh  Neurological: Negative for dizziness, syncope, speech difficulty, weakness, numbness and headaches.  Hematological: Negative for adenopathy.  Psychiatric/Behavioral: Negative for behavioral problems, dysphoric mood and agitation. The patient is not nervous/anxious.         Objective:   Physical Exam  Constitutional: She appears well-nourished. No distress.       130/85  Weight 220  Skin:       Large skin tag left upper inner thigh resected          Assessment & Plan:   Hypertension well controlled. Medication refilled Skin tag left thigh  . Resected  Procedure note- after cleansing with alcohol swab and local anesthesia with topical anesthetic, the lesion resected without difficulty. Bandage  applied

## 2012-02-28 NOTE — Patient Instructions (Signed)
Limit your sodium (Salt) intake  Please check your blood pressure on a regular basis.  If it is consistently greater than 150/90, please make an office appointment.  You need to lose weight.  Consider a lower calorie diet and regular exercise.    It is important that you exercise regularly, at least 20 minutes 3 to 4 times per week.  If you develop chest pain or shortness of breath seek  medical attention.  Return in one year for follow-up

## 2012-06-29 ENCOUNTER — Telehealth: Payer: Self-pay | Admitting: *Deleted

## 2012-06-29 ENCOUNTER — Other Ambulatory Visit: Payer: Self-pay | Admitting: Obstetrics and Gynecology

## 2012-06-29 MED ORDER — ORTHO TRI-CYCLEN (28) 0.18/0.215/0.25 MG-35 MCG PO TABS: 1.0000 | ORAL_TABLET | Freq: Every day | ORAL | Status: DC

## 2012-06-29 NOTE — Telephone Encounter (Signed)
Pt has annual scheduled with nancy on 07/17/12 requesting 1 pack of birth control pill, 1 pack sent to pharmacy.

## 2012-07-03 ENCOUNTER — Encounter: Payer: Self-pay | Admitting: Women's Health

## 2012-07-17 ENCOUNTER — Other Ambulatory Visit (HOSPITAL_COMMUNITY)
Admission: RE | Admit: 2012-07-17 | Discharge: 2012-07-17 | Disposition: A | Discharge status: Home / Self Care | Payer: Managed Care, Other (non HMO) | Source: Ambulatory Visit | Attending: Women's Health | Admitting: Women's Health

## 2012-07-17 ENCOUNTER — Encounter: Payer: Self-pay | Admitting: Women's Health

## 2012-07-17 ENCOUNTER — Ambulatory Visit (INDEPENDENT_AMBULATORY_CARE_PROVIDER_SITE_OTHER): Payer: Managed Care, Other (non HMO) | Admitting: Women's Health

## 2012-07-17 VITALS — BP 124/76 | Ht 63.0 in | Wt 211.0 lb

## 2012-07-17 DIAGNOSIS — B373 Candidiasis of vulva and vagina: Secondary | ICD-10-CM

## 2012-07-17 DIAGNOSIS — B3731 Acute candidiasis of vulva and vagina: Secondary | ICD-10-CM

## 2012-07-17 DIAGNOSIS — Z309 Encounter for contraceptive management, unspecified: Secondary | ICD-10-CM

## 2012-07-17 DIAGNOSIS — Z01419 Encounter for gynecological examination (general) (routine) without abnormal findings: Secondary | ICD-10-CM | POA: Insufficient documentation

## 2012-07-17 DIAGNOSIS — IMO0001 Reserved for inherently not codable concepts without codable children: Secondary | ICD-10-CM

## 2012-07-17 DIAGNOSIS — N898 Other specified noninflammatory disorders of vagina: Secondary | ICD-10-CM

## 2012-07-17 DIAGNOSIS — N946 Dysmenorrhea, unspecified: Secondary | ICD-10-CM

## 2012-07-17 DIAGNOSIS — N870 Mild cervical dysplasia: Secondary | ICD-10-CM

## 2012-07-17 LAB — WET PREP FOR TRICH, YEAST, CLUE: Yeast Wet Prep HPF POC: NONE SEEN

## 2012-07-17 MED ORDER — ORTHO TRI-CYCLEN (28) 0.18/0.215/0.25 MG-35 MCG PO TABS: 1.0000 | ORAL_TABLET | Freq: Every day | ORAL | Status: DC

## 2012-07-17 MED ORDER — METRONIDAZOLE 0.75 % VA GEL: VAGINAL | Status: DC

## 2012-07-17 MED ORDER — FLUCONAZOLE 150 MG PO TABS: 150.0000 mg | ORAL_TABLET | Freq: Once | ORAL | Status: DC

## 2012-07-17 MED ORDER — NAPROXEN SODIUM 550 MG PO TABS
550.0000 mg | ORAL_TABLET | Freq: Two times a day (BID) | ORAL | Status: DC
Start: 2012-07-17 — End: 2013-08-10

## 2012-07-17 NOTE — Patient Instructions (Addendum)

## 2012-07-17 NOTE — Progress Notes (Signed)
Tanya Richard 1965/06/30 [id number]    History:    The patient presents for annual exam.  Monthly cycle on Ortho Tri-Cyclen. Hypertension diagnosed in 2011 on antihypertensives. History of kidney stones. CIN-1 with cryo- in 1986 with normal Paps after. Recurrent yeast in the past. Normal mammograms. Same partner years.   Past medical history, past surgical history, family history and social history were all reviewed and documented in the EPIC chart. Works for [employer]. Working with a Systems analyst several days a week.   ROS:  A  ROS was performed and pertinent positives and negatives are included in the history.  Exam:  Filed Vitals:   07/17/12 1614  BP: 124/76    General appearance:  Normal Head/Neck:  Normal, without cervical or supraclavicular adenopathy. Thyroid:  Symmetrical, normal in size, without palpable masses or nodularity. Respiratory  Effort:  Normal  Auscultation:  Clear without wheezing or rhonchi Cardiovascular  Auscultation:  Regular rate, without rubs, murmurs or gallops  Edema/varicosities:  Not grossly evident Abdominal  Soft,nontender, without masses, guarding or rebound.  Liver/spleen:  No organomegaly noted  Hernia:  None appreciated  Skin  Inspection:  Grossly normal  Palpation:  Grossly normal Neurologic/psychiatric  Orientation:  Normal with appropriate conversation.  Mood/affect:  Normal  Genitourinary    Breasts: Examined lying and sitting/pendulous-causes upper back, neck and shoulder discomfort.     Right: Without masses, retractions, discharge or axillary adenopathy.     Left: Without masses, retractions, discharge or axillary adenopathy.   Inguinal/mons:  Normal without inguinal adenopathy  External genitalia:  Normal  BUS/Urethra/Skene's glands:  Normal  Bladder:  Normal  Vagina:  Wet prep positive for clue cells and TNTC bacteria  Cervix:  Normal  Uterus:   normal in size, shape and contour.  Midline and mobile  Adnexa/parametria:      Rt: Without masses or tenderness.   Lt: Without masses or tenderness.  Anus and perineum: Normal  Digital rectal exam: Normal sphincter tone without palpated masses or tenderness  Assessment/Plan:  47 y.o.SBF G0  for annual exam with complaint of vaginal irritation.    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 Instructed to call if no relief of discharge. Diflucan 150 by mouth x1 dose if itching occurs. SBE's, continue annual mammogram, calcium rich diet, vitamin D 1000 daily, continue regular exercise and decrease calories for weight loss encouraged. Ortho Tri-Cyclen prescription, proper use given and reviewed. History of dysmenorrhea with good relief on Ortho Tri-Cyclen. Reviewed increased risks for blood clots and heart issues with hypertension on birth control pills and accepts. Other options reviewed and declined.Pap, normal 2013. Naproxen when necessary for dysmenorrhea.   Harrington Challenger [facility], 6:03 PM 07/17/2012

## 2013-01-09 ENCOUNTER — Other Ambulatory Visit: Payer: Self-pay

## 2013-01-09 DIAGNOSIS — Z1231 Encounter for screening mammogram for malignant neoplasm of breast: Secondary | ICD-10-CM

## 2013-02-05 ENCOUNTER — Ambulatory Visit
Admission: RE | Admit: 2013-02-05 | Discharge: 2013-02-05 | Disposition: A | Discharge status: Home / Self Care | Payer: Managed Care, Other (non HMO) | Source: Ambulatory Visit

## 2013-02-05 DIAGNOSIS — Z1231 Encounter for screening mammogram for malignant neoplasm of breast: Secondary | ICD-10-CM

## 2013-05-29 ENCOUNTER — Other Ambulatory Visit: Payer: Self-pay | Admitting: Internal Medicine

## 2013-05-29 DIAGNOSIS — I10 Essential (primary) hypertension: Secondary | ICD-10-CM

## 2013-05-30 MED ORDER — LISINOPRIL-HYDROCHLOROTHIAZIDE 20-25 MG PO TABS: 1.0000 | ORAL_TABLET | Freq: Every day | ORAL | Status: DC

## 2013-05-30 NOTE — Addendum Note (Signed)
Addended by: Jimmye NormanPHANOS, Kendrick Haapala J on: 05/30/2013 10:54 AM   Modules accepted: Orders

## 2013-05-30 NOTE — Telephone Encounter (Signed)
Pt has cpx sch for 07-03-13. Please refill bp med

## 2013-05-30 NOTE — Addendum Note (Signed)
Addended by: Jimmye NormanPHANOS, Taariq Leitz J on: 05/30/2013 10:53 AM   Modules accepted: Orders

## 2013-05-30 NOTE — Telephone Encounter (Signed)
Rx sent 

## 2013-06-22 ENCOUNTER — Other Ambulatory Visit: Payer: Managed Care, Other (non HMO) | Admitting: Internal Medicine

## 2013-07-02 ENCOUNTER — Other Ambulatory Visit: Payer: Self-pay | Admitting: Internal Medicine

## 2013-07-03 ENCOUNTER — Encounter: Payer: Managed Care, Other (non HMO) | Admitting: Internal Medicine

## 2013-07-24 ENCOUNTER — Telehealth: Payer: Self-pay | Admitting: Internal Medicine

## 2013-07-24 MED ORDER — LISINOPRIL-HYDROCHLOROTHIAZIDE 20-25 MG PO TABS: ORAL_TABLET | ORAL | Status: DC

## 2013-07-24 NOTE — Telephone Encounter (Signed)
[id number] [place], [place] - [address] is requesting re-fill on lisinopril-hydrochlorothiazide (PRINZIDE,ZESTORETIC) 20-25 MG per tablet

## 2013-07-24 NOTE — Telephone Encounter (Signed)
Rx sent 

## 2013-07-26 ENCOUNTER — Ambulatory Visit (INDEPENDENT_AMBULATORY_CARE_PROVIDER_SITE_OTHER): Payer: Managed Care, Other (non HMO) | Admitting: Women's Health

## 2013-07-26 ENCOUNTER — Other Ambulatory Visit: Payer: Self-pay | Admitting: Women's Health

## 2013-07-26 ENCOUNTER — Encounter: Payer: Self-pay | Admitting: Women's Health

## 2013-07-26 VITALS — BP 150/98 | Ht 63.0 in | Wt 216.0 lb

## 2013-07-26 DIAGNOSIS — Z01419 Encounter for gynecological examination (general) (routine) without abnormal findings: Secondary | ICD-10-CM

## 2013-07-26 DIAGNOSIS — N760 Acute vaginitis: Secondary | ICD-10-CM

## 2013-07-26 DIAGNOSIS — A499 Bacterial infection, unspecified: Secondary | ICD-10-CM

## 2013-07-26 DIAGNOSIS — B9689 Other specified bacterial agents as the cause of diseases classified elsewhere: Secondary | ICD-10-CM

## 2013-07-26 DIAGNOSIS — F411 Generalized anxiety disorder: Secondary | ICD-10-CM

## 2013-07-26 LAB — WET PREP FOR TRICH, YEAST, CLUE
TRICH WET PREP: NONE SEEN
YEAST WET PREP: NONE SEEN

## 2013-07-26 MED ORDER — ALPRAZOLAM 0.25 MG PO TBDP: 0.2500 mg | ORAL_TABLET | Freq: Every evening | ORAL | Status: DC | PRN

## 2013-07-26 MED ORDER — METRONIDAZOLE 0.75 % VA GEL: VAGINAL | Status: DC

## 2013-07-26 NOTE — Progress Notes (Signed)
Tanya [name] 23-Apr-1965 [id number]    History:    Presents for annual exam.  Light monthly cycle on Ortho Tri-Cyclen for PMS type symptoms, dysmenorrhea. Same partner. 2011 Hypertensionon Lisinopril per primary care. 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 normal Paps after. Normal mammograms. Increased anxiety, sudden death of grandmother and trying to get her affairs in order has been stressful.  Past medical history, past surgical history, family history and social history were all reviewed and documented in the EPIC chart. Works for [employer]. Working with a Systems analystpersonal trainer and decreasing calories for weight loss. Mother diabetes, uncle A. fib.  ROS:  A  12 point ROS was performed and pertinent positives and negatives are included.  Exam:  Filed Vitals:   07/26/13 1603  BP: 150/98    General appearance:  Normal Thyroid:  Symmetrical, normal in size, without palpable masses or nodularity. Respiratory  Auscultation:  Clear without wheezing or rhonchi Cardiovascular  Auscultation:  Regular rate, without rubs, murmurs or gallops  Edema/varicosities:  Not grossly evident Abdominal  Soft,nontender, without masses, guarding or rebound.  Liver/spleen:  No organomegaly noted  Hernia:  None appreciated  Skin  Inspection:  Grossly normal   Breasts: Examined lying and sitting/pendulous. Upper back and shoulder pain     Right: Without masses, retractions, discharge or axillary adenopathy.     Left: Without masses, retractions, discharge or axillary adenopathy. Gentitourinary   Inguinal/mons:  Normal without inguinal adenopathy  External genitalia:  Normal  BUS/Urethra/Skene's glands:  Normal  Vagina:  Moderate white discharge, wet prep positive for amines, clues, and TNTC bacteria  Cervix:  Normal  Uterus:  normal in size, shape and contour.  Midline and mobile  Adnexa/parametria:     Rt: Without masses or tenderness.   Lt: Without masses or tenderness.  Anus and  perineum: Normal  Digital rectal exam: Normal sphincter tone without palpated masses or tenderness  Assessment/Plan:  48 y.o.SBF G0  for annual exam with complaint of poor sleep for the past month , upper back and shoulder pain from large breasts, increased vaginal discharge.  Hypertension-primary care manages labs and meds Situational stress/anxiety Bacteria vaginosis Pendulous breasts causing upper back pain/shoulder pain   Plan: 1 hypertension/contraception Stop Ortho Tri-Cyclen after current pack, condoms, call if PMS symptoms increase. Reviewed may help with blood pressure coming off of pills and decrease risks of blood clots and strokes.  2 anxiety-Xanax 0.25 at bedtime for rest and anxiety. Aware to use sparingly, addictive properties reviewed.  3 bacteria vaginosis-MetroGel vaginal cream 1 applicator at bedtime x5, prescription, proper use given and reviewed alcohol precautions.  4. Number for health maintenance -SBE's, continue annual mammogram, regular exercise, calcium rich diet, vitamin D 1000 daily encouraged. Pap normal 2013, new screening guidelines reviewed. UA.  5. Reviewed plastic surgeon consult for breast reduction.     Note: This dictation was prepared with Dragon/digital dictation.  Any transcriptional errors that result are unintentional. Harrington Challengerancy J Horrace [facility] LLCWHNP, 5:00 PM 07/26/2013

## 2013-07-26 NOTE — Patient Instructions (Signed)

## 2013-07-27 LAB — URINALYSIS W MICROSCOPIC + REFLEX CULTURE
BACTERIA UA: NONE SEEN
BILIRUBIN URINE: NEGATIVE
CRYSTALS: NONE SEEN
Casts: NONE SEEN
Glucose, UA: NEGATIVE mg/dL
KETONES UR: NEGATIVE mg/dL
Leukocytes, UA: NEGATIVE
Nitrite: NEGATIVE
PROTEIN: NEGATIVE mg/dL
Specific Gravity, Urine: 1.014 (ref 1.005–1.030)
Squamous Epithelial / LPF: NONE SEEN
UROBILINOGEN UA: 0.2 mg/dL (ref 0.0–1.0)
pH: 6 (ref 5.0–8.0)

## 2013-07-28 LAB — URINE CULTURE: Colony Count: 9000

## 2013-08-02 ENCOUNTER — Other Ambulatory Visit: Payer: Self-pay | Admitting: Internal Medicine

## 2013-08-02 DIAGNOSIS — Z0000 Encounter for general adult medical examination without abnormal findings: Secondary | ICD-10-CM

## 2013-08-02 DIAGNOSIS — I10 Essential (primary) hypertension: Secondary | ICD-10-CM

## 2013-08-03 ENCOUNTER — Other Ambulatory Visit (INDEPENDENT_AMBULATORY_CARE_PROVIDER_SITE_OTHER): Payer: Managed Care, Other (non HMO)

## 2013-08-03 DIAGNOSIS — I10 Essential (primary) hypertension: Secondary | ICD-10-CM

## 2013-08-03 DIAGNOSIS — Z0000 Encounter for general adult medical examination without abnormal findings: Secondary | ICD-10-CM

## 2013-08-03 LAB — HEPATIC FUNCTION PANEL
ALK PHOS: 74 U/L (ref 39–117)
ALT: 11 U/L (ref 0–35)
AST: 14 U/L (ref 0–37)
Albumin: 3.2 g/dL — ABNORMAL LOW (ref 3.5–5.2)
BILIRUBIN TOTAL: 0.4 mg/dL (ref 0.2–1.2)
Bilirubin, Direct: 0.1 mg/dL (ref 0.0–0.3)
TOTAL PROTEIN: 7.3 g/dL (ref 6.0–8.3)

## 2013-08-03 LAB — CBC WITH DIFFERENTIAL/PLATELET
BASOS PCT: 0.4 % (ref 0.0–3.0)
Basophils Absolute: 0 10*3/uL (ref 0.0–0.1)
EOS ABS: 0.1 10*3/uL (ref 0.0–0.7)
EOS PCT: 0.8 % (ref 0.0–5.0)
HCT: 34.3 % — ABNORMAL LOW (ref 36.0–46.0)
Hemoglobin: 11.5 g/dL — ABNORMAL LOW (ref 12.0–15.0)
LYMPHS PCT: 29.2 % (ref 12.0–46.0)
Lymphs Abs: 2.6 10*3/uL (ref 0.7–4.0)
MCHC: 33.5 g/dL (ref 30.0–36.0)
MCV: 87.3 fl (ref 78.0–100.0)
Monocytes Absolute: 0.2 10*3/uL (ref 0.1–1.0)
Monocytes Relative: 2.5 % — ABNORMAL LOW (ref 3.0–12.0)
NEUTROS PCT: 67.1 % (ref 43.0–77.0)
Neutro Abs: 5.9 10*3/uL (ref 1.4–7.7)
PLATELETS: 347 10*3/uL (ref 150.0–400.0)
RBC: 3.92 Mil/uL (ref 3.87–5.11)
RDW: 14.4 % (ref 11.5–15.5)
WBC: 8.9 10*3/uL (ref 4.0–10.5)

## 2013-08-03 LAB — POCT URINALYSIS DIPSTICK
Bilirubin, UA: NEGATIVE
GLUCOSE UA: NEGATIVE
Ketones, UA: NEGATIVE
Leukocytes, UA: NEGATIVE
Nitrite, UA: NEGATIVE
SPEC GRAV UA: 1.02
Urobilinogen, UA: 0.2
pH, UA: 6

## 2013-08-03 LAB — LIPID PANEL
CHOL/HDL RATIO: 3
Cholesterol: 152 mg/dL (ref 0–200)
HDL: 58.6 mg/dL (ref >39.00)
LDL Cholesterol: 81 mg/dL (ref 0–99)
Triglycerides: 63 mg/dL (ref 0.0–149.0)
VLDL: 12.6 mg/dL (ref 0.0–40.0)

## 2013-08-03 LAB — TSH: TSH: 0.97 u[IU]/mL (ref 0.35–4.50)

## 2013-08-03 LAB — BASIC METABOLIC PANEL
BUN: 13 mg/dL (ref 6–23)
CALCIUM: 9 mg/dL (ref 8.4–10.5)
CHLORIDE: 104 meq/L (ref 96–112)
CO2: 25 meq/L (ref 19–32)
Creatinine, Ser: 0.5 mg/dL (ref 0.4–1.2)
GFR: 160.02 mL/min (ref >60.00)
Glucose, Bld: 91 mg/dL (ref 70–99)
POTASSIUM: 3.5 meq/L (ref 3.5–5.1)
Sodium: 137 mEq/L (ref 135–145)

## 2013-08-10 ENCOUNTER — Encounter: Payer: Self-pay | Admitting: Internal Medicine

## 2013-08-10 ENCOUNTER — Ambulatory Visit (INDEPENDENT_AMBULATORY_CARE_PROVIDER_SITE_OTHER): Payer: Managed Care, Other (non HMO) | Admitting: Internal Medicine

## 2013-08-10 VITALS — BP 142/90 | HR 86 | Temp 99.0°F | Resp 20 | Ht 63.25 in | Wt 218.0 lb

## 2013-08-10 DIAGNOSIS — R002 Palpitations: Secondary | ICD-10-CM

## 2013-08-10 DIAGNOSIS — I10 Essential (primary) hypertension: Secondary | ICD-10-CM

## 2013-08-10 DIAGNOSIS — Z0000 Encounter for general adult medical examination without abnormal findings: Secondary | ICD-10-CM

## 2013-08-10 DIAGNOSIS — E669 Obesity, unspecified: Secondary | ICD-10-CM

## 2013-08-10 MED ORDER — AMLODIPINE BESYLATE 5 MG PO TABS: 5.0000 mg | ORAL_TABLET | Freq: Every day | ORAL | Status: DC

## 2013-08-10 NOTE — Progress Notes (Signed)
Subjective:    Patient ID: Tanya Richard, female    DOB: [birth year], 48 y.o.   MRN: [id number]  HPI  48 year old patient who is in today for a health maintenance examination.  She is followed annually by gynecology.  Hormone replacement therapy has been discontinued due to elevated blood pressure readings.  She does have a long history of hypertension, controlled on dual therapy.  She states her home blood pressure readings are consistently elevated to a mild degree.  There have been frequent rhythm disturbances noted on her home, blood pressure monitor  Past Medical History  Diagnosis Date  . ELEVATED BP READING WITHOUT DX HYPERTENSION 10/14/2009  . HYPERTENSION 10/28/2009  . NEPHROLITHIASIS, HX OF 12/21/2007  . Renal colic 12/21/2007  . 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 Marital Status: Single    Spouse Name: N/A    Number of Children: N/A  . Years of Education: N/A   Occupational History  . Not on file.   Social History Main Topics  . Smoking status: Never Smoker   . Smokeless tobacco: Never Used  . Alcohol Use: Yes     Comment: rare  . Drug Use: No  . Sexual Activity: Yes    Birth Control/ Protection: Pill   Other Topics Concern  . Not on file   Social History Narrative  . No narrative on file    Past Surgical History  Procedure Laterality Date  . Mouth surgery  1984  . Colposcopy    . Gynecologic cryosurgery      Family History  Problem Relation Age of Onset  . Arthritis Neg Hx     family hx  . Diabetes Mother   . Diabetes Maternal Aunt   . Hypertension Maternal Grandmother   . Cancer Maternal Grandmother     MGGM-Uterine cancer    No Known Allergies  Current Outpatient Prescriptions on File Prior to Visit  Medication Sig Dispense Refill  . ALPRAZolam (NIRAVAM) 0.25 MG dissolvable tablet Take 1 tablet (0.25 mg total) by mouth at bedtime as needed for anxiety.  30  tablet  1  . lisinopril-hydrochlorothiazide (PRINZIDE,ZESTORETIC) 20-25 MG per tablet TAKE 1 TABLET BY MOUTH DAILY.  30 tablet  0  . loratadine (CLARITIN) 10 MG tablet Take 10 mg by mouth daily.        . metroNIDAZOLE (METROGEL VAGINAL) 0.75 % vaginal gel 1 applicator per vagina at HS x 5  70 g  0  . Multiple Vitamin (MULTIVITAMIN) capsule Take 1 capsule by mouth daily.         No current facility-administered medications on file prior to visit.    BP 142/90  Pulse 86  Temp(Src) 99 F (37.2 C) (Oral)  Resp 20  Ht 5' 3.25" (1.607 m)  Wt 218 lb (98.884 kg)  BMI 38.29 kg/m2  SpO2 99%  LMP 08/05/2013     Review of Systems  Constitutional: Negative.   HENT: Negative for congestion, dental problem, hearing loss, rhinorrhea, sinus pressure, sore throat and tinnitus.   Eyes: Negative for pain, discharge and visual disturbance.  Respiratory: Negative for cough and shortness of breath.   Cardiovascular: Positive for palpitations. Negative for chest pain and leg swelling.  Gastrointestinal: Negative for nausea, vomiting, abdominal pain, diarrhea, constipation, blood in stool and abdominal distention.  Genitourinary: Negative for dysuria, urgency, frequency, hematuria, flank pain, vaginal bleeding, vaginal discharge, difficulty urinating, vaginal pain  and pelvic pain.  Musculoskeletal: Negative for arthralgias, gait problem and joint swelling.  Skin: Negative for rash.  Neurological: Negative for dizziness, syncope, speech difficulty, weakness, numbness and headaches.  Hematological: Negative for adenopathy.  Psychiatric/Behavioral: Negative for behavioral problems, dysphoric mood and agitation. The patient is not nervous/anxious.        Objective:   Physical Exam  Constitutional: She is oriented to person, place, and time. She appears well-developed and well-nourished.  Weight 2 1 8   HENT:  Head: Normocephalic.  Right Ear: External ear normal.  Left Ear: External ear normal.    Mouth/Throat: Oropharynx is clear and moist.  Pharyngeal crowding  Eyes: Conjunctivae and EOM are normal. Pupils are equal, round, and reactive to light.  Neck: Normal range of motion. Neck supple. No thyromegaly present.  Cardiovascular: Normal rate, regular rhythm, normal heart sounds and intact distal pulses.   Frequent ectopics  Pulmonary/Chest: Effort normal and breath sounds normal.  Abdominal: Soft. Bowel sounds are normal. She exhibits no mass. There is no tenderness.  Musculoskeletal: Normal range of motion.  Lymphadenopathy:    She has no cervical adenopathy.  Neurological: She is alert and oriented to person, place, and time.  Skin: Skin is warm and dry. No rash noted.  Psychiatric: She has a normal mood and affect. Her behavior is normal.          Assessment & Plan:  Preventive health examination.  Weight loss encouraged Hypertension.  Suboptimal control.  We'll add amlodipine 5.  Recheck 6 weeks Obesity weight loss encouraged.  She has rejoined a health club exercise regimen.  Encouraged

## 2013-08-10 NOTE — Patient Instructions (Signed)
Limit your sodium (Salt) intake    It is important that you exercise regularly, at least 20 minutes 3 to 4 times per week.  If you develop chest pain or shortness of breath seek  medical attention.  Please check your blood pressure on a regular basis.  If it is consistently greater than 150/90, please make an office appointment.  

## 2013-08-10 NOTE — Progress Notes (Signed)
Pre-visit discussion using our clinic review tool. No additional management support is needed unless otherwise documented below in the visit note.  

## 2013-08-13 ENCOUNTER — Telehealth: Payer: Self-pay | Admitting: Internal Medicine

## 2013-08-13 NOTE — Telephone Encounter (Signed)
Relevant patient education assigned to patient using Emmi. ° °

## 2013-09-17 ENCOUNTER — Ambulatory Visit: Payer: Managed Care, Other (non HMO) | Admitting: Internal Medicine

## 2013-10-16 ENCOUNTER — Other Ambulatory Visit: Payer: Self-pay | Admitting: *Deleted

## 2013-10-16 ENCOUNTER — Encounter: Payer: Self-pay | Admitting: Internal Medicine

## 2013-10-16 ENCOUNTER — Ambulatory Visit (INDEPENDENT_AMBULATORY_CARE_PROVIDER_SITE_OTHER): Payer: Managed Care, Other (non HMO) | Admitting: Internal Medicine

## 2013-10-16 VITALS — BP 136/80 | HR 94 | Temp 99.0°F | Resp 20 | Ht 63.25 in | Wt 221.0 lb

## 2013-10-16 DIAGNOSIS — I10 Essential (primary) hypertension: Secondary | ICD-10-CM

## 2013-10-16 DIAGNOSIS — E669 Obesity, unspecified: Secondary | ICD-10-CM

## 2013-10-16 MED ORDER — LISINOPRIL-HYDROCHLOROTHIAZIDE 20-25 MG PO TABS: ORAL_TABLET | ORAL | Status: DC

## 2013-10-16 NOTE — Progress Notes (Signed)
Subjective:    Patient ID: Tanya Richard, female    DOB: 04-28-1965, 48 y.o.   MRN: [id number]  HPI 48 year old patient who has treated hypertension.  She does monitor home blood pressure readings with nice results.  She runs from low normal to high normal and rarely elevated. She brings her home blood pressure monitor to the office today.  There was nice correlation with her blood pressure machine.  Several readings were in the low normal range.  She is tolerating her medications well.  Past Medical History  Diagnosis Date  . ELEVATED BP READING WITHOUT DX HYPERTENSION 10/14/2009  . HYPERTENSION 10/28/2009  . NEPHROLITHIASIS, HX OF 12/21/2007  . Renal colic 12/21/2007  . 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 Marital Status: Single    Spouse Name: N/A    Number of Children: N/A  . Years of Education: N/A   Occupational History  . Not on file.   Social History Main Topics  . Smoking status: Never Smoker   . Smokeless tobacco: Never Used  . Alcohol Use: Yes     Comment: rare  . Drug Use: No  . Sexual Activity: Yes    Birth Control/ Protection: Pill   Other Topics Concern  . Not on file   Social History Narrative  . No narrative on file    Past Surgical History  Procedure Laterality Date  . Mouth surgery  1984  . Colposcopy    . Gynecologic cryosurgery      Family History  Problem Relation Age of Onset  . Arthritis Neg Hx     family hx  . Diabetes Mother   . Diabetes Maternal Aunt   . Hypertension Maternal Grandmother   . Cancer Maternal Grandmother     MGGM-Uterine cancer    No Known Allergies  Current Outpatient Prescriptions on File Prior to Visit  Medication Sig Dispense Refill  . amLODipine (NORVASC) 5 MG tablet Take 1 tablet (5 mg total) by mouth daily.  90 tablet  3  . lisinopril-hydrochlorothiazide (PRINZIDE,ZESTORETIC) 20-25 MG per tablet TAKE 1 TABLET BY MOUTH  DAILY.  30 tablet  0  . loratadine (CLARITIN) 10 MG tablet Take 10 mg by mouth daily as needed.       . Multiple Vitamin (MULTIVITAMIN) capsule Take 1 capsule by mouth daily.        . naproxen sodium (ANAPROX) 550 MG tablet Take 550 mg by mouth 2 (two) times daily between meals as needed.      . ALPRAZolam (NIRAVAM) 0.25 MG dissolvable tablet Take 1 tablet (0.25 mg total) by mouth at bedtime as needed for anxiety.  30 tablet  1   No current facility-administered medications on file prior to visit.    BP 136/80  Pulse 94  Temp(Src) 99 F (37.2 C) (Oral)  Resp 20  Ht 5' 3.25" (1.607 m)  Wt 221 lb (100.245 kg)  BMI 38.82 kg/m2  SpO2 99%     Review of Systems  Constitutional: Negative.   HENT: Negative for congestion, dental problem, hearing loss, rhinorrhea, sinus pressure, sore throat and tinnitus.   Eyes: Negative for pain, discharge and visual disturbance.  Respiratory: Negative for cough and shortness of breath.   Cardiovascular: Negative for chest pain, palpitations and leg swelling.  Gastrointestinal: Negative for nausea, vomiting, abdominal pain, diarrhea, constipation, blood in stool and abdominal distention.  Genitourinary: Negative for dysuria, urgency,  frequency, hematuria, flank pain, vaginal bleeding, vaginal discharge, difficulty urinating, vaginal pain and pelvic pain.  Musculoskeletal: Negative for arthralgias, gait problem and joint swelling.  Skin: Negative for rash.  Neurological: Negative for dizziness, syncope, speech difficulty, weakness, numbness and headaches.  Hematological: Negative for adenopathy.  Psychiatric/Behavioral: Negative for behavioral problems, dysphoric mood and agitation. The patient is not nervous/anxious.        Objective:   Physical Exam  Constitutional: She appears well-developed and well-nourished.  Blood pressure 108/70-130/90 range.          Assessment & Plan:     Hypertension.  Well controlled.  We'll continue a low-salt  diet and present regimen.  Nonpharmacologic measures.  Discussed at length Exogenous obesity.  Weight loss, exercise, all encouraged  We'll continue home blood pressure monitoring Recheck 6 months

## 2013-10-16 NOTE — Patient Instructions (Signed)
Limit your sodium (Salt) intake  Please check your blood pressure on a regular basis.  If it is consistently greater than 150/90, please make an office appointment.  Return in 6 months for follow-up  DASH Eating Plan DASH stands for "Dietary Approaches to Stop Hypertension." The DASH eating plan is a healthy eating plan that has been shown to reduce high blood pressure (hypertension). Additional health benefits may include reducing the risk of type 2 diabetes mellitus, heart disease, and stroke. The DASH eating plan may also help with weight loss. WHAT DO I NEED TO KNOW ABOUT THE DASH EATING PLAN? For the DASH eating plan, you will follow these general guidelines:  Choose foods with a percent daily value for sodium of less than 5% (as listed on the food label).  Use salt-free seasonings or herbs instead of table salt or sea salt.  Check with your health care provider or pharmacist before using salt substitutes.  Eat lower-sodium products, often labeled as "lower sodium" or "no salt added."  Eat fresh foods.  Eat more vegetables, fruits, and low-fat dairy products.  Choose whole grains. Look for the word "whole" as the first word in the ingredient list.  Choose fish and skinless chicken or Mal[place]key more often than red meat. Limit fish, poultry, and meat to 6 oz (170 g) each day.  Limit sweets, desserts, sugars, and sugary drinks.  Choose heart-healthy fats.  Limit cheese to 1 oz (28 g) per day.  Eat more home-cooked food and less restaurant, buffet, and fast food.  Limit fried foods.  Cook foods using methods other than frying.  Limit canned vegetables. If you do use them, rinse them well to decrease the sodium.  When eating at a restaurant, ask that your food be prepared with less salt, or no salt if possible. WHAT FOODS CAN I EAT? Seek help from a dietitian for individual calorie needs. Grains Whole grain or whole wheat bread. Brown rice. Whole grain or whole wheat pasta.  Quinoa, bulgur, and whole grain cereals. Low-sodium cereals. Corn or whole wheat flour tortillas. Whole grain cornbread. Whole grain crackers. Low-sodium crackers. Vegetables Fresh or frozen vegetables (raw, steamed, roasted, or grilled). Low-sodium or reduced-sodium tomato and vegetable juices. Low-sodium or reduced-sodium tomato sauce and paste. Low-sodium or reduced-sodium canned vegetables.  Fruits All fresh, canned (in natural juice), or frozen fruits. Meat and Other Protein Products Ground beef (85% or leaner), grass-fed beef, or beef trimmed of fat. Skinless chicken or [place]key. Ground chicken or [place]urkey. Pork trimmed of fat. All fish and seafood. Eggs. Dried beans, peas, or lentils. Unsalted nuts and seeds. Unsalted canned beans. Dairy Low-fat dairy products, such as skim or 1% milk, 2% or reduced-fat cheeses, low-fat ricotta or cottage cheese, or plain low-fat yogurt. Low-sodium or reduced-sodium cheeses. Fats and Oils Tub margarines without trans fats. Light or reduced-fat mayonnaise and salad dressings (reduced sodium). Avocado. Safflower, olive, or canola oils. Natural peanut or almond butter. Other Unsalted popcorn and pretzels. The items listed above may not be a complete list of recommended foods or beverages. Contact your dietitian for more options. WHAT FOODS ARE NOT RECOMMENDED? Grains White bread. White pasta. White rice. Refined cornbread. Bagels and croissants. Crackers that contain trans fat. Vegetables Creamed or fried vegetables. Vegetables in a cheese sauce. Regular canned vegetables. Regular canned tomato sauce and paste. Regular tomato and vegetable juices. Fruits Dried fruits. Canned fruit in light or heavy syrup. Fruit juice. Meat and Other Protein Products Fatty cuts of meat. Ribs, chicken wings,  bacon, sausage, bologna, salami, chitterlings, fatback, hot dogs, bratwurst, and packaged luncheon meats. Salted nuts and seeds. Canned beans with salt. Dairy Whole or 2%  milk, cream, half-and-half, and cream cheese. Whole-fat or sweetened yogurt. Full-fat cheeses or blue cheese. Nondairy creamers and whipped toppings. Processed cheese, cheese spreads, or cheese curds. Condiments Onion and garlic salt, seasoned salt, table salt, and sea salt. Canned and packaged gravies. Worcestershire sauce. Tartar sauce. Barbecue sauce. Teriyaki sauce. Soy sauce, including reduced sodium. Steak sauce. Fish sauce. Oyster sauce. Cocktail sauce. Horseradish. Ketchup and mustard. Meat flavorings and tenderizers. Bouillon cubes. Hot sauce. Tabasco sauce. Marinades. Taco seasonings. Relishes. Fats and Oils Butter, stick margarine, lard, shortening, ghee, and bacon fat. Coconut, palm kernel, or palm oils. Regular salad dressings. Other Pickles and olives. Salted popcorn and pretzels. The items listed above may not be a complete list of foods and beverages to avoid. Contact your dietitian for more information. WHERE CAN I FIND MORE INFORMATION? National Heart, Lung, and Blood Institute: www.nhlbi.nih.gov/health/health-topics/topics/dash/ Document Released: 02/11/2011 Document Revised: 07/09/2013 Document Reviewed: 12/27/2012 ExitCare Patient Information 2015 [employer]. This information is not intended to replace advice given to you by your health care provider. Make sure you discuss any questions you have with your health care provider.  

## 2013-10-16 NOTE — Progress Notes (Signed)
Pre visit review using our clinic review tool, if applicable. No additional management support is needed unless otherwise documented below in the visit note. 

## 2014-01-09 ENCOUNTER — Other Ambulatory Visit: Payer: Self-pay

## 2014-01-09 DIAGNOSIS — Z1231 Encounter for screening mammogram for malignant neoplasm of breast: Secondary | ICD-10-CM

## 2014-01-18 ENCOUNTER — Other Ambulatory Visit: Payer: Self-pay | Admitting: Women's Health

## 2014-01-18 ENCOUNTER — Telehealth: Payer: Self-pay | Admitting: *Deleted

## 2014-01-18 MED ORDER — NORETHIN ACE-ETH ESTRAD-FE 1-20 MG-MCG PO TABS: 1.0000 | ORAL_TABLET | Freq: Every day | ORAL | Status: DC

## 2014-01-18 NOTE — Telephone Encounter (Signed)
Telephone call to reviewed risks of starting back on birth control pills of blood clots, strokes, nonsmoker. States has had second pill for blood pressure is better controlled 134/80, has been on birth control pills for greater than 20 years needs for both contraception and cycle control. Last cycle was very heavy with large clots. Has been on Ortho Tri-Cyclen, will try 20 g pill Loestrin 1/20 prescription, proper use E scribe. Start up reviewed, condoms first month. Same partner.

## 2014-01-18 NOTE — Telephone Encounter (Signed)
Overdue for annual exam, have her schedule will need to discuss options. Birth control pills not the safest with elevated blood pressure. Mirena IUD may be a good option, will hopefully decrease flow, will not effect blood pressure

## 2014-01-18 NOTE — Telephone Encounter (Signed)
Pt called requesting to start back on ortho try cycle states periods are become painful again. Please advise

## 2014-01-18 NOTE — Telephone Encounter (Signed)
Patient had annual on 07/26/13, said had annual with Dr.K (notes in epic) and blood pressure has been better. Dr.K gave her medication to help with B/P. Pt said Mirena is not a choice for her.

## 2014-02-08 ENCOUNTER — Ambulatory Visit (INDEPENDENT_AMBULATORY_CARE_PROVIDER_SITE_OTHER): Payer: 59 | Admitting: Family Medicine

## 2014-02-08 ENCOUNTER — Encounter: Payer: Self-pay | Admitting: Family Medicine

## 2014-02-08 VITALS — BP 124/84 | HR 98 | Temp 98.2°F | Ht 63.25 in | Wt 221.3 lb

## 2014-02-08 DIAGNOSIS — R300 Dysuria: Secondary | ICD-10-CM

## 2014-02-08 LAB — POCT URINALYSIS DIPSTICK
Bilirubin, UA: NEGATIVE
Glucose, UA: NEGATIVE
KETONES UA: NEGATIVE
NITRITE UA: NEGATIVE
PROTEIN UA: NEGATIVE
Spec Grav, UA: 1.01
Urobilinogen, UA: 0.2
pH, UA: 5.5

## 2014-02-08 MED ORDER — CIPROFLOXACIN HCL 250 MG PO TABS: 250.0000 mg | ORAL_TABLET | Freq: Two times a day (BID) | ORAL | Status: DC

## 2014-02-08 NOTE — Patient Instructions (Signed)

## 2014-02-08 NOTE — Progress Notes (Signed)
Pre visit review using our clinic review tool, if applicable. No additional management support is needed unless otherwise documented below in the visit note. 

## 2014-02-08 NOTE — Progress Notes (Signed)
HPI:  Acute visit for Dysuria: -started 2-3 days ago -symptoms: frequency, urgency, dysuria - she actually reports feels better today -denies: fevers, hematuria, NVD, flank pain, vaginal discharge or symptoms -FDLMP: Nov 10th, normal, regular -hx of UTI and this feels similar  ROS: See pertinent positives and negatives per HPI.  Past Medical History  Diagnosis Date  . ELEVATED BP READING WITHOUT DX HYPERTENSION 10/14/2009  . HYPERTENSION 10/28/2009  . NEPHROLITHIASIS, HX OF 12/21/2007  . Renal colic 12/21/2007  . CIN I (cervical intraepithelial neoplasia I) 1986    SLIGHT DYSPLASIA-C&B, ECC, CRYO    Past Surgical History  Procedure Laterality Date  . Mouth surgery  1984  . Colposcopy    . Gynecologic cryosurgery      Family History  Problem Relation Age of Onset  . Arthritis Neg Hx     family hx  . Diabetes Mother   . Diabetes Maternal Aunt   . Hypertension Maternal Grandmother   . Cancer Maternal Grandmother     MGGM-Uterine cancer    History   Social History  . Marital Status: Single    Spouse Name: N/A    Number of Children: N/A  . Years of Education: N/A   Social History Main Topics  . Smoking status: Never Smoker   . Smokeless tobacco: Never Used  . Alcohol Use: Yes     Comment: rare  . Drug Use: No  . Sexual Activity: Yes    Birth Control/ Protection: Pill   Other Topics Concern  . None   Social History Narrative    Current outpatient prescriptions: ALPRAZolam (NIRAVAM) 0.25 MG dissolvable tablet, Take 1 tablet (0.25 mg total) by mouth at bedtime as needed for anxiety., Disp: 30 tablet, Rfl: 1;  amLODipine (NORVASC) 5 MG tablet, Take 1 tablet (5 mg total) by mouth daily., Disp: 90 tablet, Rfl: 3;  lisinopril-hydrochlorothiazide (PRINZIDE,ZESTORETIC) 20-25 MG per tablet, TAKE 1 TABLET BY MOUTH DAILY., Disp: 90 tablet, Rfl: 1 loratadine (CLARITIN) 10 MG tablet, Take 10 mg by mouth daily as needed. , Disp: , Rfl: ;  Multiple Vitamin (MULTIVITAMIN)  capsule, Take 1 capsule by mouth daily.  , Disp: , Rfl: ;  naproxen sodium (ANAPROX) 550 MG tablet, Take 550 mg by mouth 2 (two) times daily between meals as needed., Disp: , Rfl: ;  norethindrone-ethinyl estradiol (JUNEL FE,GILDESS FE,LOESTRIN FE) 1-20 MG-MCG tablet, Take 1 tablet by mouth daily., Disp: 3 Package, Rfl: 0 ciprofloxacin (CIPRO) 250 MG tablet, Take 1 tablet (250 mg total) by mouth 2 (two) times daily., Disp: 6 tablet, Rfl: 0  EXAM:  Filed Vitals:   02/08/14 1407  BP: 124/84  Pulse: 98  Temp: 98.2 F (36.8 C)    Body mass index is 38.87 kg/(m^2).  GENERAL: vitals reviewed and listed above, alert, oriented, appears well hydrated and in no acute distress  HEENT: atraumatic, conjunttiva clear, no obvious abnormalities on inspection of external nose and ears  NECK: no obvious masses on inspection  LUNGS: clear to auscultation bilaterally, no wheezes, rales or rhonchi, good air movement  CV: HRRR, no peripheral edema  ABD: no CVA TTP  MS: moves all extremities without noticeable abnormality  PSYCH: pleasant and cooperative, no obvious depression or anxiety  ASSESSMENT AND PLAN:  Discussed the following assessment and plan:  Dysuria - Plan: POC Urinalysis Dipstick, ciprofloxacin (CIPRO) 250 MG tablet  -a little blood and leuks on urine -discussed potential for UTI and she opted for empiric cipro  -culture pending -Patient advised to return  or notify a doctor immediately if symptoms worsen or persist or new concerns arise.  There are no Patient Instructions on file for this visit.   Colin Benton R.

## 2014-02-11 ENCOUNTER — Ambulatory Visit: Payer: Managed Care, Other (non HMO)

## 2014-02-12 ENCOUNTER — Ambulatory Visit
Admission: RE | Admit: 2014-02-12 | Discharge: 2014-02-12 | Disposition: A | Discharge status: Home / Self Care | Payer: 59 | Source: Ambulatory Visit

## 2014-02-12 DIAGNOSIS — Z1231 Encounter for screening mammogram for malignant neoplasm of breast: Secondary | ICD-10-CM

## 2014-02-13 ENCOUNTER — Encounter: Payer: Self-pay | Admitting: Women's Health

## 2014-02-15 ENCOUNTER — Telehealth: Payer: Self-pay | Admitting: Internal Medicine

## 2014-02-15 NOTE — Telephone Encounter (Signed)
Pt would like a call back about her lab results she had on 02/08/14

## 2014-02-15 NOTE — Telephone Encounter (Signed)
I called the pt and apologized and she stated she took the antibiotic that was given and she is not having any symptoms.

## 2014-02-15 NOTE — Telephone Encounter (Signed)
Please let her know that it looks like a culture was not done. Please apologize for this. If she is still having symptoms would advise repeat urine studies - thought the antibiotic should have taken care of infection if there.

## 2014-03-21 ENCOUNTER — Telehealth: Payer: Self-pay | Admitting: Women's Health

## 2014-03-21 NOTE — Telephone Encounter (Signed)
Telephone call to review blood pressure readings, had left a list of blood pressures that ranged from 114/71 2 129/82, Will continue Loestrin 1/20 and follow-up with primary care for hypertension.

## 2014-04-08 ENCOUNTER — Other Ambulatory Visit: Payer: Self-pay

## 2014-04-08 MED ORDER — NORETHIN ACE-ETH ESTRAD-FE 1-20 MG-MCG PO TABS: 1.0000 | ORAL_TABLET | Freq: Every day | ORAL | Status: DC

## 2014-04-17 ENCOUNTER — Telehealth: Payer: Self-pay

## 2014-04-17 MED ORDER — LISINOPRIL-HYDROCHLOROTHIAZIDE 20-25 MG PO TABS: ORAL_TABLET | ORAL | Status: DC

## 2014-04-17 NOTE — Telephone Encounter (Signed)
[facility] refill request for LISINOPRIL-HCTZ 20-25MG  TAB

## 2014-07-18 ENCOUNTER — Other Ambulatory Visit: Payer: Self-pay | Admitting: Internal Medicine

## 2014-08-08 ENCOUNTER — Encounter: Payer: Self-pay | Admitting: Women's Health

## 2014-08-08 ENCOUNTER — Ambulatory Visit (INDEPENDENT_AMBULATORY_CARE_PROVIDER_SITE_OTHER): Payer: Managed Care, Other (non HMO) | Admitting: Women's Health

## 2014-08-08 ENCOUNTER — Other Ambulatory Visit (HOSPITAL_COMMUNITY)
Admission: RE | Admit: 2014-08-08 | Discharge: 2014-08-08 | Disposition: A | Discharge status: Home / Self Care | Payer: Managed Care, Other (non HMO) | Source: Ambulatory Visit | Attending: Women's Health | Admitting: Women's Health

## 2014-08-08 VITALS — BP 110/82 | Ht 63.0 in | Wt 218.0 lb

## 2014-08-08 DIAGNOSIS — Z01419 Encounter for gynecological examination (general) (routine) without abnormal findings: Secondary | ICD-10-CM | POA: Diagnosis not present

## 2014-08-08 DIAGNOSIS — Z3041 Encounter for surveillance of contraceptive pills: Secondary | ICD-10-CM

## 2014-08-08 DIAGNOSIS — Z1151 Encounter for screening for human papillomavirus (HPV): Secondary | ICD-10-CM | POA: Insufficient documentation

## 2014-08-08 DIAGNOSIS — Z01411 Encounter for gynecological examination (general) (routine) with abnormal findings: Secondary | ICD-10-CM | POA: Diagnosis not present

## 2014-08-08 MED ORDER — NORETHIN ACE-ETH ESTRAD-FE 1-20 MG-MCG PO TABS: 1.0000 | ORAL_TABLET | Freq: Every day | ORAL | Status: DC

## 2014-08-08 NOTE — Progress Notes (Signed)
Tanya JoyLisa Bearden 1965-04-27 [id number]    History:    Presents for annual exam.  Regular monthly cycle, had stopped OCs hoping cycles would be less heavy,  cycles became heavy  with cramps. Hypertension with good control on medication, blood pressure has been stable on Loestrin. 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 Same partner. Had been working with a Systems analystpersonal trainer and had a torn ligament  right foot, has follow-up scheduled.  Past medical history, past surgical history, family history and social history were all reviewed and documented in the EPIC chart. Works for [employer], Health and safety inspectordesk job. Mother diabetes.  ROS:  A ROS was performed and pertinent positives and negatives are included.  Exam:  Filed Vitals:   08/08/14 1604  BP: 110/82    General appearance:  Normal Thyroid:  Symmetrical, normal in size, without palpable masses or nodularity. Respiratory  Auscultation:  Clear without wheezing or rhonchi Cardiovascular  Auscultation:  Regular rate, without rubs, murmurs or gallops  Edema/varicosities:  Not grossly evident Abdominal  Soft,nontender, without masses, guarding or rebound.  Liver/spleen:  No organomegaly noted  Hernia:  None appreciated  Skin  Inspection:  Grossly normal   Breasts: Examined lying and sitting.     Right: Without masses, retractions, discharge or axillary adenopathy.     Left: Without masses, retractions, discharge or axillary adenopathy. Gentitourinary   Inguinal/mons:  Normal without inguinal adenopathy  External genitalia:  Normal  BUS/Urethra/Skene's glands:  Normal  Vagina:  Normal  Cervix:  Normal  Uterus:   normal in size, shape and contour.  Midline and mobile  Adnexa/parametria:     Rt: Without masses or tenderness.   Lt: Without masses or tenderness.  Anus and perineum: Normal  Digital rectal exam: Normal sphincter tone without palpated masses or tenderness  Assessment/Plan:  49 y.o. SBF G0 for annual exam  with good relief of menorrhagia and dysmenorrhea on OCs  Monthly cycle on Loestrin History of kidney stone Obesity Hypertension primary care manages labs and meds Right foot torn ligaments-follow-up scheduled  Plan: Loestrin 1/20 prescription, proper use given and reviewed risks of blood clots and strokes. Continue to monitor blood pressure if any elevations instructed to call. Is aware of hazards of hypertension. SBE's, continue annual 3-D mammogram history of dense breasts. Regular exercise as able, calcium rich diet, vitamin D 1000 daily encouraged. UA, Pap with HR HPV typing, new screening guidelines reviewed.   Harrington ChallengerYOUNG,Jana Swartzlander J [facility] - IndianapolisWHNP, 4:45 PM 08/08/2014

## 2014-08-08 NOTE — Patient Instructions (Signed)
Health Maintenance Adopting a healthy lifestyle and getting preventive care can go a long way to promote health and wellness. Talk with your health care provider about what schedule of regular examinations is right for you. This is a good chance for you to check in with your provider about disease prevention and staying healthy. In between checkups, there are plenty of things you can do on your own. Experts have done a lot of research about which lifestyle changes and preventive measures are most likely to keep you healthy. Ask your health care provider for more information. WEIGHT AND DIET  Eat a healthy diet  Be sure to include plenty of vegetables, fruits, low-fat dairy products, and lean protein.  Do not eat a lot of foods high in solid fats, added sugars, or salt.  Get regular exercise. This is one of the most important things you can do for your health.  Most adults should exercise for at least 150 minutes each week. The exercise should increase your heart rate and make you sweat (moderate-intensity exercise).  Most adults should also do strengthening exercises at least twice a week. This is in addition to the moderate-intensity exercise.  Maintain a healthy weight  Body mass index (BMI) is a measurement that can be used to identify possible weight problems. It estimates body fat based on height and weight. Your health care provider can help determine your BMI and help you achieve or maintain a healthy weight.  For females 25 years of age and older:   A BMI below 18.5 is considered underweight.  A BMI of 18.5 to 24.9 is normal.  A BMI of 25 to 29.9 is considered overweight.  A BMI of 30 and above is considered obese.  Watch levels of cholesterol and blood lipids  You should start having your blood tested for lipids and cholesterol at 47 years of age, then have this test every 5 years.  You may need to have your cholesterol levels checked more often if:  Your lipid or  cholesterol levels are high.  You are older than 47 years of age.  You are at high risk for heart disease.  CANCER SCREENING   Lung Cancer  Lung cancer screening is recommended for adults 47-92 years old who are at high risk for lung cancer because of a history of smoking.  A yearly low-dose CT scan of the lungs is recommended for people who:  Currently smoke.  Have quit within the past 15 years.  Have at least a 30-pack-year history of smoking. A pack year is smoking an average of one pack of cigarettes a day for 1 year.  Yearly screening should continue until it has been 15 years since you quit.  Yearly screening should stop if you develop a health problem that would prevent you from having lung cancer treatment.  Breast Cancer  Practice breast self-awareness. This means understanding how your breasts normally appear and feel.  It also means doing regular breast self-exams. Let your health care provider know about any changes, no matter how small.  If you are in your 47s or 30s, you should have a clinical breast exam (CBE) by a health care provider every 1-3 years as part of a regular health exam.  If you are 47 or older, have a CBE every year. Also consider having a breast X-ray (mammogram) every year.  If you have a family history of breast cancer, talk to your health care provider about genetic screening.  If you are  at high risk for breast cancer, talk to your health care provider about having an MRI and a mammogram every year.  Breast cancer gene (BRCA) assessment is recommended for women who have family members with BRCA-related cancers. BRCA-related cancers include:  Breast.  Ovarian.  Tubal.  Peritoneal cancers.  Results of the assessment will determine the need for genetic counseling and BRCA1 and BRCA2 testing. Cervical Cancer Routine pelvic examinations to screen for cervical cancer are no longer recommended for nonpregnant women who are considered low  risk for cancer of the pelvic organs (ovaries, uterus, and vagina) and who do not have symptoms. A pelvic examination may be necessary if you have symptoms including those associated with pelvic infections. Ask your health care provider if a screening pelvic exam is right for you.   The Pap test is the screening test for cervical cancer for women who are considered at risk.  If you had a hysterectomy for a problem that was not cancer or a condition that could lead to cancer, then you no longer need Pap tests.  If you are older than 65 years, and you have had normal Pap tests for the past 10 years, you no longer need to have Pap tests.  If you have had past treatment for cervical cancer or a condition that could lead to cancer, you need Pap tests and screening for cancer for at least 20 years after your treatment.  If you no longer get a Pap test, assess your risk factors if they change (such as having a new sexual partner). This can affect whether you should start being screened again.  Some women have medical problems that increase their chance of getting cervical cancer. If this is the case for you, your health care provider may recommend more frequent screening and Pap tests.  The human papillomavirus (HPV) test is another test that may be used for cervical cancer screening. The HPV test looks for the virus that can cause cell changes in the cervix. The cells collected during the Pap test can be tested for HPV.  The HPV test can be used to screen women 30 years of age and older. Getting tested for HPV can extend the interval between normal Pap tests from three to five years.  An HPV test also should be used to screen women of any age who have unclear Pap test results.  After 47 years of age, women should have HPV testing as often as Pap tests.  Colorectal Cancer  This type of cancer can be detected and often prevented.  Routine colorectal cancer screening usually begins at 47 years of  age and continues through 47 years of age.  Your health care provider may recommend screening at an earlier age if you have risk factors for colon cancer.  Your health care provider may also recommend using home test kits to check for hidden blood in the stool.  A small camera at the end of a tube can be used to examine your colon directly (sigmoidoscopy or colonoscopy). This is done to check for the earliest forms of colorectal cancer.  Routine screening usually begins at age 50.  Direct examination of the colon should be repeated every 5-10 years through 47 years of age. However, you may need to be screened more often if early forms of precancerous polyps or small growths are found. Skin Cancer  Check your skin from head to toe regularly.  Tell your health care provider about any new moles or changes in   moles, especially if there is a change in a mole's shape or color.  Also tell your health care provider if you have a mole that is larger than the size of a pencil eraser.  Always use sunscreen. Apply sunscreen liberally and repeatedly throughout the day.  Protect yourself by wearing long sleeves, pants, a wide-brimmed hat, and sunglasses whenever you are outside. HEART DISEASE, DIABETES, AND HIGH BLOOD PRESSURE   Have your blood pressure checked at least every 1-2 years. High blood pressure causes heart disease and increases the risk of stroke.  If you are between 75 years and 42 years old, ask your health care provider if you should take aspirin to prevent strokes.  Have regular diabetes screenings. This involves taking a blood sample to check your fasting blood sugar level.  If you are at a normal weight and have a low risk for diabetes, have this test once every three years after 47 years of age.  If you are overweight and have a high risk for diabetes, consider being tested at a younger age or more often. PREVENTING INFECTION  Hepatitis B  If you have a higher risk for  hepatitis B, you should be screened for this virus. You are considered at high risk for hepatitis B if:  You were born in a country where hepatitis B is common. Ask your health care provider which countries are considered high risk.  Your parents were born in a high-risk country, and you have not been immunized against hepatitis B (hepatitis B vaccine).  You have HIV or AIDS.  You use needles to inject street drugs.  You live with someone who has hepatitis B.  You have had sex with someone who has hepatitis B.  You get hemodialysis treatment.  You take certain medicines for conditions, including cancer, organ transplantation, and autoimmune conditions. Hepatitis C  Blood testing is recommended for:  Everyone born from [birth year] through 1963.  Anyone with known risk factors for hepatitis C. Sexually transmitted infections (STIs)  You should be screened for sexually transmitted infections (STIs) including gonorrhea and chlamydia if:  You are sexually active and are younger than 48 years of age.  You are older than 47 years of age and your health care provider tells you that you are at risk for this type of infection.  Your sexual activity has changed since you were last screened and you are at an increased risk for chlamydia or gonorrhea. Ask your health care provider if you are at risk.  If you do not have HIV, but are at risk, it may be recommended that you take a prescription medicine daily to prevent HIV infection. This is called pre-exposure prophylaxis (PrEP). You are considered at risk if:  You are sexually active and do not regularly use condoms or know the HIV status of your partner(s).  You take drugs by injection.  You are sexually active with a partner who has HIV. Talk with your health care provider about whether you are at high risk of being infected with HIV. If you choose to begin PrEP, you should first be tested for HIV. You should then be tested every 3 months for  as long as you are taking PrEP.  PREGNANCY   If you are premenopausal and you may become pregnant, ask your health care provider about preconception counseling.  If you may become pregnant, take 400 to 800 micrograms (mcg) of folic acid every day.  If you want to prevent pregnancy, talk to your  health care provider about birth control (contraception). OSTEOPOROSIS AND MENOPAUSE   Osteoporosis is a disease in which the bones lose minerals and strength with aging. This can result in serious bone fractures. Your risk for osteoporosis can be identified using a bone density scan.  If you are 8 years of age or older, or if you are at risk for osteoporosis and fractures, ask your health care provider if you should be screened.  Ask your health care provider whether you should take a calcium or vitamin D supplement to lower your risk for osteoporosis.  Menopause may have certain physical symptoms and risks.  Hormone replacement therapy may reduce some of these symptoms and risks. Talk to your health care provider about whether hormone replacement therapy is right for you.  HOME CARE INSTRUCTIONS   Schedule regular health, dental, and eye exams.  Stay current with your immunizations.   Do not use any tobacco products including cigarettes, chewing tobacco, or electronic cigarettes.  If you are pregnant, do not drink alcohol.  If you are breastfeeding, limit how much and how often you drink alcohol.  Limit alcohol intake to no more than 1 drink per day for nonpregnant women. One drink equals 12 ounces of beer, 5 ounces of wine, or 1 ounces of hard liquor.  Do not use street drugs.  Do not share needles.  Ask your health care provider for help if you need support or information about quitting drugs.  Tell your health care provider if you often feel depressed.  Tell your health care provider if you have ever been abused or do not feel safe at home. Document Released: 09/07/2010  Document Revised: 07/09/2013 Document Reviewed: 01/24/2013 [facility] Patient Information 2015 [place], [place]. This information is not intended to replace advice given to you by your health care provider. Make sure you discuss any questions you have with your health care provider. Fat and Cholesterol Control Diet Fat and cholesterol levels in your blood and organs are influenced by your diet. High levels of fat and cholesterol may lead to diseases of the heart, small and large blood vessels, gallbladder, liver, and pancreas. CONTROLLING FAT AND CHOLESTEROL WITH DIET Although exercise and lifestyle factors are important, your diet is key. That is because certain foods are known to raise cholesterol and others to lower it. The goal is to balance foods for their effect on cholesterol and more importantly, to replace saturated and trans fat with other types of fat, such as monounsaturated fat, polyunsaturated fat, and omega-3 fatty acids. On average, a person should consume no more than 15 to 17 g of saturated fat daily. Saturated and trans fats are considered "bad" fats, and they will raise LDL cholesterol. Saturated fats are primarily found in animal products such as meats, butter, and cream. However, that does not mean you need to give up all your favorite foods. Today, there are good tasting, low-fat, low-cholesterol substitutes for most of the things you like to eat. Choose low-fat or nonfat alternatives. Choose round or loin cuts of red meat. These types of cuts are lowest in fat and cholesterol. Chicken (without the skin), fish, veal, and ground [place] breast are great choices. Eliminate fatty meats, such as hot dogs and salami. Even shellfish have little or no saturated fat. Have a 3 oz (85 g) portion when you eat lean meat, poultry, or fish. Trans fats are also called "partially hydrogenated oils." They are oils that have been scientifically manipulated so that they are solid at room  temperature resulting  in a longer shelf life and improved taste and texture of foods in which they are added. Trans fats are found in stick margarine, some tub margarines, cookies, crackers, and baked goods.  When baking and cooking, oils are a great substitute for butter. The monounsaturated oils are especially beneficial since it is believed they lower LDL and raise HDL. The oils you should avoid entirely are saturated tropical oils, such as coconut and palm.  Remember to eat a lot from food groups that are naturally free of saturated and trans fat, including fish, fruit, vegetables, beans, grains (barley, rice, couscous, bulgur wheat), and pasta (without cream sauces).  IDENTIFYING FOODS THAT LOWER FAT AND CHOLESTEROL  Soluble fiber may lower your cholesterol. This type of fiber is found in fruits such as apples, vegetables such as broccoli, potatoes, and carrots, legumes such as beans, peas, and lentils, and grains such as barley. Foods fortified with plant sterols (phytosterol) may also lower cholesterol. You should eat at least 2 g per day of these foods for a cholesterol lowering effect.  Read package labels to identify low-saturated fats, trans fat free, and low-fat foods at the supermarket. Select cheeses that have only 2 to 3 g saturated fat per ounce. Use a heart-healthy tub margarine that is free of trans fats or partially hydrogenated oil. When buying baked goods (cookies, crackers), avoid partially hydrogenated oils. Breads and muffins should be made from whole grains (whole-wheat or whole oat flour, instead of "flour" or "enriched flour"). Buy non-creamy canned soups with reduced salt and no added fats.  FOOD PREPARATION TECHNIQUES  Never deep-fry. If you must fry, either stir-fry, which uses very little fat, or use non-stick cooking sprays. When possible, broil, bake, or roast meats, and steam vegetables. Instead of putting butter or margarine on vegetables, use lemon and herbs, applesauce, and cinnamon (for squash  and sweet potatoes). Use nonfat yogurt, salsa, and low-fat dressings for salads.  LOW-SATURATED FAT / LOW-FAT FOOD SUBSTITUTES Meats / Saturated Fat (g)  Avoid: Steak, marbled (3 oz/85 g) / 11 g  Choose: Steak, lean (3 oz/85 g) / 4 g  Avoid: Hamburger (3 oz/85 g) / 7 g  Choose: Hamburger, lean (3 oz/85 g) / 5 g  Avoid: Ham (3 oz/85 g) / 6 g  Choose: Ham, lean cut (3 oz/85 g) / 2.4 g  Avoid: Chicken, with skin, dark meat (3 oz/85 g) / 4 g  Choose: Chicken, skin removed, dark meat (3 oz/85 g) / 2 g  Avoid: Chicken, with skin, light meat (3 oz/85 g) / 2.5 g  Choose: Chicken, skin removed, light meat (3 oz/85 g) / 1 g Dairy / Saturated Fat (g)  Avoid: Whole milk (1 cup) / 5 g  Choose: Low-fat milk, 2% (1 cup) / 3 g  Choose: Low-fat milk, 1% (1 cup) / 1.5 g  Choose: Skim milk (1 cup) / 0.3 g  Avoid: Hard cheese (1 oz/28 g) / 6 g  Choose: Skim milk cheese (1 oz/28 g) / 2 to 3 g  Avoid: Cottage cheese, 4% fat (1 cup) / 6.5 g  Choose: Low-fat cottage cheese, 1% fat (1 cup) / 1.5 g  Avoid: Ice cream (1 cup) / 9 g  Choose: Sherbet (1 cup) / 2.5 g  Choose: Nonfat frozen yogurt (1 cup) / 0.3 g  Choose: Frozen fruit bar / trace  Avoid: Whipped cream (1 tbs) / 3.5 g  Choose: Nondairy whipped topping (1 tbs) / 1 g Condiments /  Saturated Fat (g)  Avoid: Mayonnaise (1 tbs) / 2 g  Choose: Low-fat mayonnaise (1 tbs) / 1 g  Avoid: Butter (1 tbs) / 7 g  Choose: Extra light margarine (1 tbs) / 1 g  Avoid: Coconut oil (1 tbs) / 11.8 g  Choose: Olive oil (1 tbs) / 1.8 g  Choose: Corn oil (1 tbs) / 1.7 g  Choose: Safflower oil (1 tbs) / 1.2 g  Choose: Sunflower oil (1 tbs) / 1.4 g  Choose: Soybean oil (1 tbs) / 2.4 g  Choose: Canola oil (1 tbs) / 1 g Document Released: 02/22/2005 Document Revised: 06/19/2012 Document Reviewed: 05/23/2013 [employer] Patient Information 2015 [employer]. This information is not intended to replace advice given to you by your health  care provider. Make sure you discuss any questions you have with your health care provider.  

## 2014-08-12 LAB — CYTOLOGY - PAP

## 2014-09-30 ENCOUNTER — Other Ambulatory Visit: Payer: Self-pay | Admitting: Internal Medicine

## 2014-12-10 ENCOUNTER — Telehealth: Payer: Self-pay | Admitting: Internal Medicine

## 2014-12-10 NOTE — Telephone Encounter (Signed)
Pt needs cpx before end of year. Can I create 30  Min slot?

## 2014-12-10 NOTE — Telephone Encounter (Signed)
Yes, that is fine. 

## 2014-12-11 NOTE — Telephone Encounter (Signed)
Pt has been scheudled

## 2014-12-19 ENCOUNTER — Other Ambulatory Visit: Payer: Self-pay | Admitting: Internal Medicine

## 2014-12-20 ENCOUNTER — Emergency Department (HOSPITAL_BASED_OUTPATIENT_CLINIC_OR_DEPARTMENT_OTHER)
Admission: EM | Admit: 2014-12-20 | Discharge: 2014-12-20 | Disposition: A | Discharge status: Home / Self Care | Payer: Managed Care, Other (non HMO) | Attending: Emergency Medicine | Admitting: Emergency Medicine

## 2014-12-20 ENCOUNTER — Encounter (HOSPITAL_BASED_OUTPATIENT_CLINIC_OR_DEPARTMENT_OTHER): Payer: Self-pay | Admitting: Adult Health

## 2014-12-20 DIAGNOSIS — Y9389 Activity, other specified: Secondary | ICD-10-CM | POA: Insufficient documentation

## 2014-12-20 DIAGNOSIS — Y9289 Other specified places as the place of occurrence of the external cause: Secondary | ICD-10-CM | POA: Insufficient documentation

## 2014-12-20 DIAGNOSIS — Z87442 Personal history of urinary calculi: Secondary | ICD-10-CM | POA: Insufficient documentation

## 2014-12-20 DIAGNOSIS — X58XXXA Exposure to other specified factors, initial encounter: Secondary | ICD-10-CM | POA: Insufficient documentation

## 2014-12-20 DIAGNOSIS — Z79899 Other long term (current) drug therapy: Secondary | ICD-10-CM | POA: Diagnosis not present

## 2014-12-20 DIAGNOSIS — R220 Localized swelling, mass and lump, head: Secondary | ICD-10-CM | POA: Diagnosis present

## 2014-12-20 DIAGNOSIS — I10 Essential (primary) hypertension: Secondary | ICD-10-CM | POA: Insufficient documentation

## 2014-12-20 DIAGNOSIS — T783XXA Angioneurotic edema, initial encounter: Secondary | ICD-10-CM | POA: Diagnosis not present

## 2014-12-20 DIAGNOSIS — Y998 Other external cause status: Secondary | ICD-10-CM | POA: Insufficient documentation

## 2014-12-20 MED ORDER — FAMOTIDINE IN NACL 20-0.9 MG/50ML-% IV SOLN
20.0000 mg | Freq: Once | INTRAVENOUS | Status: AC
  Administered 2014-12-20: 20 mg via INTRAVENOUS
  Filled 2014-12-20: qty 50

## 2014-12-20 MED ORDER — PREDNISONE 20 MG PO TABS: ORAL_TABLET | ORAL | Status: DC

## 2014-12-20 MED ORDER — DIPHENHYDRAMINE HCL 50 MG/ML IJ SOLN
25.0000 mg | Freq: Once | INTRAMUSCULAR | Status: AC
  Administered 2014-12-20: 25 mg via INTRAVENOUS
  Filled 2014-12-20: qty 1

## 2014-12-20 MED ORDER — METHYLPREDNISOLONE SODIUM SUCC 125 MG IJ SOLR
125.0000 mg | Freq: Once | INTRAMUSCULAR | Status: AC
  Administered 2014-12-20: 125 mg via INTRAVENOUS
  Filled 2014-12-20: qty 2

## 2014-12-20 NOTE — Discharge Instructions (Signed)
Angioedema  Angioedema is a sudden swelling of tissues, often of the skin. It can occur on the face or genitals or in the abdomen or other body parts. The swelling usually develops over a short period and gets better in 24 to 48 hours. It often begins during the night and is found when the person wakes up. The person may also get red, itchy patches of skin (hives). Angioedema can be dangerous if it involves swelling of the air passages.   Depending on the cause, episodes of angioedema may only happen once, come back in unpredictable patterns, or repeat for several years and then gradually fade away.   CAUSES   Angioedema can be caused by an allergic reaction to various triggers. It can also result from nonallergic causes, including reactions to drugs, immune system disorders, viral infections, or an abnormal gene that is passed to you from your parents (hereditary). For some people with angioedema, the cause is unknown.   Some things that can trigger angioedema include:    Foods.    Medicines, such as ACE inhibitors, ARBs, nonsteroidal anti-inflammatory agents, or estrogen.    Latex.    Animal saliva.    Insect stings.    Dyes used in X-rays.    Mild injury.    Dental work.   Surgery.   Stress.    Sudden changes in temperature.    Exercise.  SIGNS AND SYMPTOMS    Swelling of the skin.   Hives. If these are present, there is also intense itching.   Redness in the affected area.    Pain in the affected area.   Swollen lips or tongue.   Breathing problems. This may happen if the air passages swell.   Wheezing.  If internal organs are involved, there may be:    Nausea.    Abdominal pain.    Vomiting.    Difficulty swallowing.    Difficulty passing urine.  DIAGNOSIS    Your health care provider will examine the affected area and take a medical and family history.   Various tests may be done to help determine the cause. Tests may include:   Allergy skin tests to see if the problem  is an allergic reaction.    Blood tests to check for hereditary angioedema.    Tests to check for underlying diseases that could cause the condition.    A review of your medicines, including over-the-counter medicines, may be done.  TREATMENT   Treatment will depend on the cause of the angioedema. Possible treatments include:    Removal of anything that triggered the condition (such as stopping certain medicines).    Medicines to treat symptoms or prevent attacks. Medicines given may include:     Antihistamines.     Epinephrine injection.     Steroids.    Hospitalization may be required for severe attacks. If the air passages are affected, it can be an emergency. Tubes may need to be placed to keep the airway open.  HOME CARE INSTRUCTIONS    Take all medicines as directed by your health care provider.   If you were given medicines for emergency allergy treatment, always carry them with you.   Wear a medical bracelet as directed by your health care provider.    Avoid known triggers.  SEEK MEDICAL CARE IF:    You have repeat attacks of angioedema.    Your attacks are more frequent or more severe despite preventive measures.    You have hereditary angioedema   and are considering having children. It is important to discuss with your health care provider the risks of passing the condition on to your children.  SEEK IMMEDIATE MEDICAL CARE IF:    You have severe swelling of the mouth, tongue, or lips.   You have difficulty breathing.    You have difficulty swallowing.    You faint.  MAKE SURE YOU:   Understand these instructions.   Will watch your condition.   Will get help right away if you are not doing well or get worse.     This information is not intended to replace advice given to you by your health care provider. Make sure you discuss any questions you have with your health care provider.     Document Released: 05/03/2001 Document Revised: 03/15/2014 Document Reviewed:  10/16/2012  Elsevier Interactive Patient Education 2016 Elsevier Inc.

## 2014-12-20 NOTE — ED Provider Notes (Signed)
CSN: [id number]   Arrival date & time 12/20/14 2021  History  By signing my name below, I, Tanya Richard, attest that this documentation has been prepared under the direction and in the presence of Rolan BuccoMelanie Decklyn Hyder, MD. Electronically Signed: Bethel BornBritney Richard, ED Scribe. 12/20/2014. 8:32 PM.  Chief Complaint  Patient presents with  . Angioedema    HPI The history is provided by the patient. No language interpreter was used.   Tanya Richard is a 49 y.o. female with history of HTN who presents to the Emergency Department complaining of increasing swelling at the upper lip with onset around 4 PM. Initially she thought she had a fever blister and was applying Abreva but the area continued to worsen.  Pt denies throat or tongue swelling, pain at the upper lip, difficulty breathing, SOB, rash, and itching. Pt denies new food exposure. No new medication(notes being on lisinopril for 4 years and amlodipine for 3 years) or cosmetics.  Primary care at [facility].   Past Medical History  Diagnosis Date  . ELEVATED BP READING WITHOUT DX HYPERTENSION 10/14/2009  . HYPERTENSION 10/28/2009  . NEPHROLITHIASIS, HX OF 12/21/2007  . Renal colic 12/21/2007    Past Surgical History  Procedure Laterality Date  . Mouth surgery  1984  . Colposcopy    . Gynecologic cryosurgery      Family History  Problem Relation Age of Onset  . Arthritis Neg Hx     family hx  . Diabetes Mother   . Diabetes Maternal Aunt   . Hypertension Maternal Grandmother   . Cancer Maternal Grandmother     MGGM-Uterine cancer    Social History  Substance Use Topics  . Smoking status: Never Smoker   . Smokeless tobacco: Never Used  . Alcohol Use: 0.0 oz/week    0 Standard drinks or equivalent per week     Comment: rare     Review of Systems  Constitutional: Negative for fever, chills, diaphoresis and fatigue.  HENT: Positive for facial swelling. Negative for congestion, rhinorrhea and sneezing.   Eyes: Negative.    Respiratory: Negative for cough, chest tightness and shortness of breath.   Cardiovascular: Negative for chest pain and leg swelling.  Gastrointestinal: Negative for nausea, vomiting, abdominal pain, diarrhea and blood in stool.  Genitourinary: Negative for frequency, hematuria, flank pain and difficulty urinating.  Musculoskeletal: Negative for back pain and arthralgias.  Skin: Negative for rash.  Neurological: Negative for dizziness, speech difficulty, weakness, numbness and headaches.   Home Medications   Prior to Admission medications   Medication Sig Start Date End Date Taking? Authorizing Provider  amLODipine (NORVASC) 5 MG tablet TAKE 1 TABLET (5 MG TOTAL) BY MOUTH DAILY. 09/30/14   Gordy SaversPeter F Kwiatkowski, MD  lisinopril-hydrochlorothiazide (PRINZIDE,ZESTORETIC) 20-25 MG tablet TAKE 1 TABLET BY MOUTH DAILY. 12/19/14   Gordy SaversPeter F Kwiatkowski, MD  loratadine (CLARITIN) 10 MG tablet Take 10 mg by mouth daily as needed.     Historical Provider, MD  Multiple Vitamin (MULTIVITAMIN) capsule Take 1 capsule by mouth daily.      Historical Provider, MD  norethindrone-ethinyl estradiol (JUNEL FE,GILDESS FE,LOESTRIN FE) 1-20 MG-MCG tablet Take 1 tablet by mouth daily. 08/08/14   Harrington ChallengerNancy J Young, NP  predniSONE (DELTASONE) 20 MG tablet 1 tabs po daily x 5 days 12/20/14   Rolan BuccoMelanie Nygeria Lager, MD    Allergies  Review of patient's allergies indicates no known allergies.  Triage Vitals: BP 135/76 mmHg  Pulse 102  Temp(Src) 98.7 F (37.1 C) (Oral)  Resp 20  Wt 202 lb (91.627 kg)  SpO2 100%  Physical Exam  Constitutional: She is oriented to person, place, and time. She appears well-developed and well-nourished.  HENT:  Head: Normocephalic and atraumatic.  Positive diffuse swelling of the upper lip. There is no intraoral edema. No trismus  Eyes: Pupils are equal, round, and reactive to light.  Neck: Normal range of motion. Neck supple.  Cardiovascular: Normal rate, regular rhythm and normal heart sounds.    Pulmonary/Chest: Effort normal and breath sounds normal. No respiratory distress. She has no wheezes. She has no rales. She exhibits no tenderness.  Abdominal: Soft. Bowel sounds are normal. There is no tenderness. There is no rebound and no guarding.  Musculoskeletal: Normal range of motion. She exhibits no edema.  Lymphadenopathy:    She has no cervical adenopathy.  Neurological: She is alert and oriented to person, place, and time.  Skin: Skin is warm and dry. No rash noted.  Psychiatric: She has a normal mood and affect.    ED Course  Procedures   DIAGNOSTIC STUDIES: Oxygen Saturation is 100% on RA, normal by my interpretation.    COORDINATION OF CARE: 8:31 PM Discussed treatment plan which includes steroids and Benadryl with pt at bedside and pt agreed to plan.  Labs Reviewed - No data to display  Imaging Review No results found.     MDM   Final diagnoses:  Angioedema, initial encounter   Patient presents with angioedema localized to her upper lip. She has no airway compromise. She was given Solu-Medrol Benadryl and Pepcid in the ED. She was monitored for a couple of hours and had ongoing improvement of her swelling. She was discharged home in good condition. She was advised not to take her lisinopril anymore. She will continue her Norvasc. I advised her to contact her doctor on Monday to see if he wanted to make changes in her blood pressure management. She was advised to return here if she has any worsening symptoms. She was started on a five-day course of prednisone. Strict return precautions were given.   I personally performed the services described in this documentation, which was scribed in my presence.  The recorded information has been reviewed and considered.    Rolan Bucco, MD 12/20/14 2242

## 2014-12-20 NOTE — ED Notes (Signed)
Presents with angioedema of upper lip that has worsened over the last 4 hours-pt takes lisinopril and has for 4 years, this began about 2-3 hours after taking and is progressing, dnies SOB or throat swelling, denies tongue swelling.

## 2014-12-23 ENCOUNTER — Telehealth: Payer: Self-pay | Admitting: Internal Medicine

## 2014-12-23 MED ORDER — HYDROCHLOROTHIAZIDE 25 MG PO TABS: 25.0000 mg | ORAL_TABLET | Freq: Every day | ORAL | Status: DC

## 2014-12-23 NOTE — Telephone Encounter (Signed)
Please call in a new prescription for hydrochlorothiazide 25 mg #90 one daily.  Continue amlodipine

## 2014-12-23 NOTE — Telephone Encounter (Signed)
Spoke to pt, told her Dr. Kirtland BouchardK is going to start her on HCTZ 25 mg one tablet daily and to continue to take Amlodipine. Rx sent to pharmacy. Pt verbalized understanding and asked if she needs follow up. Told her yes, since she was in the ED should schedule follow up. Pt verbalized understanding.

## 2014-12-23 NOTE — Telephone Encounter (Signed)
Pt went to Emergency room  on Friday 10/14 due to edema swelling in lips. Determined to be associated w/ her lisinopril-hydrochlorothiazide (PRINZIDE,ZESTORETIC) 20-25 MG tablet Pt taken off lisinipril , and started 5 day course of prednisone, started by a Prednisone IV in the ED.  Pt wants to know if you need to prescribe something else. Pt already takes amLODipine (NORVASC) 5 MG tablet  yesterday bp 128/83  Pt states she is doing fine, no other issues and bp is good.  [facility]

## 2014-12-23 NOTE — Telephone Encounter (Signed)
Please see message and advise 

## 2014-12-25 ENCOUNTER — Encounter: Payer: Self-pay | Admitting: Internal Medicine

## 2014-12-25 ENCOUNTER — Ambulatory Visit (INDEPENDENT_AMBULATORY_CARE_PROVIDER_SITE_OTHER)
Admission: RE | Admit: 2014-12-25 | Discharge: 2014-12-25 | Disposition: A | Discharge status: Home / Self Care | Payer: Managed Care, Other (non HMO) | Source: Ambulatory Visit | Attending: Internal Medicine | Admitting: Internal Medicine

## 2014-12-25 ENCOUNTER — Ambulatory Visit (INDEPENDENT_AMBULATORY_CARE_PROVIDER_SITE_OTHER): Payer: Managed Care, Other (non HMO) | Admitting: Internal Medicine

## 2014-12-25 VITALS — BP 132/80 | HR 96 | Temp 98.4°F | Wt 210.8 lb

## 2014-12-25 DIAGNOSIS — R05 Cough: Secondary | ICD-10-CM

## 2014-12-25 DIAGNOSIS — T783XXS Angioneurotic edema, sequela: Secondary | ICD-10-CM

## 2014-12-25 DIAGNOSIS — R053 Chronic cough: Secondary | ICD-10-CM

## 2014-12-25 DIAGNOSIS — I10 Essential (primary) hypertension: Secondary | ICD-10-CM

## 2014-12-25 MED ORDER — BENZONATATE 100 MG PO CAPS: 100.0000 mg | ORAL_CAPSULE | Freq: Three times a day (TID) | ORAL | Status: DC | PRN

## 2014-12-25 MED ORDER — HYDROCODONE-HOMATROPINE 5-1.5 MG/5ML PO SYRP: 5.0000 mL | ORAL_SOLUTION | ORAL | Status: DC | PRN

## 2014-12-25 NOTE — Patient Instructions (Addendum)
Your chest exam is clear today but because of the severity and persistence of her cough please get chest x-ray. Pending on the results of your chest x-ray we will proceed.  I suspect to have a postinfectious irritable airway cough that might of been aggravated by the lisinopril. The could be some mild underlying allergy. Stop all cough drops anything with menthol vitamins with oil because they can irritate the airway and make the cough worse.  Can suck on sugar free non-menthol candy such as jolly ranchers.  Continue use prescription medicines to try to calm the cough and decrease the irritability. Hydrocodone at night as needed Can try Tessalon Perles in the day less likely to cause sedation. Or Delsym over-the-counter. Add nasal cortisone 2 sprays each side of the nose once a day to make sure that there is no post sinus congestion that is irritating your airway and making cough. Can add Chlor-Trimeton or chlorpheniramine and older antihistamine at night or day to see if it calms cough.   At this time it doesn't appear that an antibiotic will help you get better quicker based on your symptoms and exam. However if your phlegm turns more discolored or you get a fever contact us for advice.  Cough, Adult Coughing is a reflex that clears your throat and your airways. Coughing helps to heal and protect your lungs. It is normal to cough occasionally, but a cough that happens with other symptoms or lasts a long time may be a sign of a condition that needs treatment. A cough may last only 2-3 weeks (acute), or it may last longer than 8 weeks (chronic). CAUSES Coughing is commonly caused by:  Breathing in substances that irritate your lungs.  A viral or bacterial respiratory infection.  Allergies.  Asthma.  Postnasal drip.  Smoking.  Acid backing up from the stomach into the esophagus (gastroesophageal reflux).  Certain medicines.  Chronic lung problems, including COPD (or rarely, lung  cancer).  Other medical conditions such as heart failure. HOME CARE INSTRUCTIONS  Pay attention to any changes in your symptoms. Take these actions to help with your discomfort:  Take medicines only as told by your health care provider.  If you were prescribed an antibiotic medicine, take it as told by your health care provider. Do not stop taking the antibiotic even if you start to feel better.  Talk with your health care provider before you take a cough suppressant medicine.  Drink enough fluid to keep your urine clear or pale yellow.  If the air is dry, use a cold steam vaporizer or humidifier in your bedroom or your home to help loosen secretions.  Avoid anything that causes you to cough at work or at home.  If your cough is worse at night, try sleeping in a semi-upright position.  Avoid cigarette smoke. If you smoke, quit smoking. If you need help quitting, ask your health care provider.  Avoid caffeine.  Avoid alcohol.  Rest as needed. SEEK MEDICAL CARE IF:   You have new symptoms.  You cough up pus.  Your cough does not get better after 2-3 weeks, or your cough gets worse.  You cannot control your cough with suppressant medicines and you are losing sleep.  You develop pain that is getting worse or pain that is not controlled with pain medicines.  You have a fever.  You have unexplained weight loss.  You have night sweats. SEEK IMMEDIATE MEDICAL CARE IF:  You cough up blood.  You have  difficulty breathing.  Your heartbeat is very fast.   This information is not intended to replace advice given to you by your health care provider. Make sure you discuss any questions you have with your health care provider.   Document Released: 08/21/2010 Document Revised: 11/13/2014 Document Reviewed: 05/01/2014 Elsevier Interactive Patient Education [employer]2016 Elsevier Inc.

## 2014-12-25 NOTE — Progress Notes (Signed)
Pre visit review using our clinic review tool, if applicable. No additional management support is needed unless otherwise documented below in the visit note.  Chief Complaint  Patient presents with  . Cough    HPI: Patient Tanya Richard  comes in today for SDA for acute  problem evaluation. PCP NA   Was seen in ed for angioedema  10 14  And pcp changed ace hctz to hctz 2 days ago   Give nsolumedreol pepcid in ed  And 5 days of pred  But has had a cough or cold since ealry October   10 2 with sore throat and then  Cough   Didn't change with  pred in ed .  Cough upper airway sometimes clear mucous  No blood or pus.  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  robuitussin mucinex dm  Cough up mucous and then emesis  No fever sob cp  No hx asthma  ROS: See pertinent positives and negatives per HPI.  Past Medical History  Diagnosis Date  . ELEVATED BP READING WITHOUT DX HYPERTENSION 10/14/2009  . HYPERTENSION 10/28/2009  . NEPHROLITHIASIS, HX OF 12/21/2007  . Renal colic 12/21/2007    Family History  Problem Relation Age of Onset  . Arthritis Neg Hx     family hx  . Diabetes Mother   . Diabetes Maternal Aunt   . Hypertension Maternal Grandmother   . Cancer Maternal Grandmother     MGGM-Uterine cancer    Social History   Social History  . Marital Status: Single    Spouse Name: N/A  . Number of Children: N/A  . Years of Education: N/A   Social History Main Topics  . Smoking status: Never Smoker   . Smokeless tobacco: Never Used  . Alcohol Use: 0.0 oz/week    0 Standard drinks or equivalent per week     Comment: rare  . Drug Use: No  . Sexual Activity: Yes    Birth Control/ Protection: Pill     Comment: INTERCOURSE AGE 79, SEXUAL PARTNERS LESS THAN 5   Other Topics Concern  . None   Social History Narrative    Outpatient Prescriptions Prior to Visit  Medication Sig Dispense Refill  . amLODipine  (NORVASC) 5 MG tablet TAKE 1 TABLET (5 MG TOTAL) BY MOUTH DAILY. 90 tablet 0  . loratadine (CLARITIN) 10 MG tablet Take 10 mg by mouth daily as needed.     . Multiple Vitamin (MULTIVITAMIN) capsule Take 1 capsule by mouth daily.      . norethindrone-ethinyl estradiol (JUNEL FE,GILDESS FE,LOESTRIN FE) 1-20 MG-MCG tablet Take 1 tablet by mouth daily. 3 Package 4  . hydrochlorothiazide (HYDRODIURIL) 25 MG tablet Take 1 tablet (25 mg total) by mouth daily. (Patient not taking: Reported on 12/25/2014) 90 tablet 1  . lisinopril-hydrochlorothiazide (PRINZIDE,ZESTORETIC) 20-25 MG tablet TAKE 1 TABLET BY MOUTH DAILY. 90 tablet 0  . predniSONE (DELTASONE) 20 MG tablet 1 tabs po daily x 5 days 5 tablet 0   No facility-administered medications prior to visit.     EXAM:  BP 132/80 mmHg  Pulse 96  Temp(Src) 98.4 F (36.9 C) (Oral)  Wt 210 lb 12.8 oz (95.618 kg)  SpO2 98%  Body mass index is 37.35 kg/(m^2).  GENERAL: vitals reviewed and listed above, alert, oriented, appears well hydrated and in no acute distress HEENT: atraumatic, conjunctiva  clear, no obvious abnormalities on inspection of external nose  and ears  Mild nasal congestion no face pain OP : no lesion edema or exudate  NECK: no obvious masses on inspection palpation  LUNGS: clear to auscultation bilaterally, no wheezes, rales or rhonchi, good air movement? Intermittent coughing  Through out visit  Sounds upper airway and upper bronchial .  CV: HRRR, no clubbing cyanosis or  peripheral edema nl cap refill  MS: moves all extremities without noticeable focal  abnormality PSYCH: pleasant and cooperative, no obvious depression or anxiety  ASSESSMENT AND PLAN:  Discussed the following assessment and plan:  Cough, persistent - actsviral resp infection resolving with coughing/cyclic and some post cough emesis no findings of bacterial infection at this time and didnt resp to pred - Plan: DG Chest 2 View  Angioedema, sequela - from ace  inhibitor gone  off for a week .   Essential hypertension Looks congested upper  But patient denies this?  Suspect some ur  contributing so add nincs   Cough suppression to try  Tessalon and hydrocodone  Sugar free candy  Fluids  And time ? If acei? Prolongs coughing illness   Not yet at the 3 weeks mark  Get x ray   dont force the cough  Fu if  persistent or progressive  -Patient advised to return or notify health care team  if symptoms worsen ,persist or new concerns arise. Total visit > 50% spent counseling and coordinating care as indicated in above note and in instructions to patient .     Patient Instructions  Your chest exam is clear today but because of the severity and persistence of her cough please get chest x-ray. Pending on the results of your chest x-ray we will proceed.  I suspect to have a postinfectious irritable airway cough that might of been aggravated by the lisinopril. The could be some mild underlying allergy. Stop all cough drops anything with menthol vitamins with oil because they can irritate the airway and make the cough worse.  Can suck on sugar free non-menthol candy such as jolly ranchers.  Continue use prescription medicines to try to calm the cough and decrease the irritability. Hydrocodone at night as needed Can try Tessalon Perles in the day less likely to cause sedation. Or Delsym over-the-counter. Add nasal cortisone 2 sprays each side of the nose once a day to make sure that there is no post sinus congestion that is irritating your airway and making cough. Can add Chlor-Trimeton or chlorpheniramine and older antihistamine at night or day to see if it calms cough.   At this time it doesn't appear that an antibiotic will help you get better quicker based on your symptoms and exam. However if your phlegm turns more discolored or you get a fever contact us for advice.  Cough, Adult Coughing is a reflex that clears your throat and your airways.  Coughing helps to heal and protect your lungs. It is normal to cough occasionally, but a cough that happens with other symptoms or lasts a long time may be a sign of a condition that needs treatment. A cough may last only 2-3 weeks (acute), or it may last longer than 8 weeks (chronic). CAUSES Coughing is commonly caused by:  Breathing in substances that irritate your lungs.  A viral or bacterial respiratory infection.  Allergies.  Asthma.  Postnasal drip.  Smoking.  Acid backing up from the stomach into the esophagus (gastroesophageal reflux).  Certain medicines.  Chronic lung problems, including COPD (or rarely, lung cancer).  Other medical conditions such as heart failure. HOME CARE INSTRUCTIONS  Pay attention to any changes in your symptoms. Take these actions to help with your discomfort:  Take medicines only as told by your health care provider.  If you were prescribed an antibiotic medicine, take it as told by your health care provider. Do not stop taking the antibiotic even if you start to feel better.  Talk with your health care provider before you take a cough suppressant medicine.  Drink enough fluid to keep your urine clear or pale yellow.  If the air is dry, use a cold steam vaporizer or humidifier in your bedroom or your home to help loosen secretions.  Avoid anything that causes you to cough at work or at home.  If your cough is worse at night, try sleeping in a semi-upright position.  Avoid cigarette smoke. If you smoke, quit smoking. If you need help quitting, ask your health care provider.  Avoid caffeine.  Avoid alcohol.  Rest as needed. SEEK MEDICAL CARE IF:   You have new symptoms.  You cough up pus.  Your cough does not get better after 2-3 weeks, or your cough gets worse.  You cannot control your cough with suppressant medicines and you are losing sleep.  You develop pain that is getting worse or pain that is not controlled with pain  medicines.  You have a fever.  You have unexplained weight loss.  You have night sweats. SEEK IMMEDIATE MEDICAL CARE IF:  You cough up blood.  You have difficulty breathing.  Your heartbeat is very fast.   This information is not intended to replace advice given to you by your health care provider. Make sure you discuss any questions you have with your health care provider.   Document Released: 08/21/2010 Document Revised: 11/13/2014 Document Reviewed: 05/01/2014 Elsevier Interactive Patient Education 2016 [employer].       [place]. Christophere Hillhouse M.D.

## 2015-01-06 ENCOUNTER — Other Ambulatory Visit: Payer: Self-pay

## 2015-01-06 DIAGNOSIS — Z1231 Encounter for screening mammogram for malignant neoplasm of breast: Secondary | ICD-10-CM

## 2015-01-17 ENCOUNTER — Other Ambulatory Visit: Payer: Self-pay | Admitting: Internal Medicine

## 2015-02-06 ENCOUNTER — Other Ambulatory Visit (INDEPENDENT_AMBULATORY_CARE_PROVIDER_SITE_OTHER): Payer: Managed Care, Other (non HMO)

## 2015-02-06 DIAGNOSIS — Z0000 Encounter for general adult medical examination without abnormal findings: Secondary | ICD-10-CM | POA: Diagnosis not present

## 2015-02-06 LAB — CBC WITH DIFFERENTIAL/PLATELET
Basophils Absolute: 0.1 10*3/uL (ref 0.0–0.1)
Basophils Relative: 0.4 % (ref 0.0–3.0)
EOS PCT: 0.8 % (ref 0.0–5.0)
Eosinophils Absolute: 0.1 10*3/uL (ref 0.0–0.7)
HCT: 36.7 % (ref 36.0–46.0)
Hemoglobin: 12.2 g/dL (ref 12.0–15.0)
LYMPHS ABS: 3.3 10*3/uL (ref 0.7–4.0)
Lymphocytes Relative: 24.7 % (ref 12.0–46.0)
MCHC: 33.1 g/dL (ref 30.0–36.0)
MCV: 86 fl (ref 78.0–100.0)
MONOS PCT: 3.1 % (ref 3.0–12.0)
Monocytes Absolute: 0.4 10*3/uL (ref 0.1–1.0)
NEUTROS ABS: 9.6 10*3/uL — AB (ref 1.4–7.7)
Neutrophils Relative %: 71 % (ref 43.0–77.0)
PLATELETS: 391 10*3/uL (ref 150.0–400.0)
RBC: 4.27 Mil/uL (ref 3.87–5.11)
RDW: 14.5 % (ref 11.5–15.5)
WBC: 13.6 10*3/uL — ABNORMAL HIGH (ref 4.0–10.5)

## 2015-02-06 LAB — POCT URINALYSIS DIPSTICK
BILIRUBIN UA: NEGATIVE
Glucose, UA: NEGATIVE
KETONES UA: NEGATIVE
Leukocytes, UA: NEGATIVE
Nitrite, UA: NEGATIVE
PH UA: 6
Protein, UA: NEGATIVE
Spec Grav, UA: 1.025
Urobilinogen, UA: 0.2

## 2015-02-06 LAB — HEPATIC FUNCTION PANEL
ALBUMIN: 3.7 g/dL (ref 3.5–5.2)
ALT: 27 U/L (ref 0–35)
AST: 19 U/L (ref 0–37)
Alkaline Phosphatase: 106 U/L (ref 39–117)
BILIRUBIN TOTAL: 0.4 mg/dL (ref 0.2–1.2)
Bilirubin, Direct: 0.1 mg/dL (ref 0.0–0.3)
Total Protein: 7.4 g/dL (ref 6.0–8.3)

## 2015-02-06 LAB — BASIC METABOLIC PANEL
BUN: 12 mg/dL (ref 6–23)
CHLORIDE: 103 meq/L (ref 96–112)
CO2: 28 meq/L (ref 19–32)
Calcium: 9.4 mg/dL (ref 8.4–10.5)
Creatinine, Ser: 0.5 mg/dL (ref 0.40–1.20)
GFR: 170.02 mL/min (ref >60.00)
GLUCOSE: 93 mg/dL (ref 70–99)
POTASSIUM: 3.4 meq/L — AB (ref 3.5–5.1)
SODIUM: 140 meq/L (ref 135–145)

## 2015-02-06 LAB — LIPID PANEL
CHOL/HDL RATIO: 3
Cholesterol: 162 mg/dL (ref 0–200)
HDL: 49.3 mg/dL (ref >39.00)
LDL Cholesterol: 99 mg/dL (ref 0–99)
NONHDL: 112.43
Triglycerides: 68 mg/dL (ref 0.0–149.0)
VLDL: 13.6 mg/dL (ref 0.0–40.0)

## 2015-02-06 LAB — TSH: TSH: 1.17 u[IU]/mL (ref 0.35–4.50)

## 2015-02-14 ENCOUNTER — Encounter: Payer: Self-pay | Admitting: Internal Medicine

## 2015-02-14 ENCOUNTER — Ambulatory Visit (INDEPENDENT_AMBULATORY_CARE_PROVIDER_SITE_OTHER): Payer: Managed Care, Other (non HMO) | Admitting: Internal Medicine

## 2015-02-14 VITALS — BP 122/70 | HR 97 | Temp 98.6°F | Ht 63.0 in | Wt 216.0 lb

## 2015-02-14 DIAGNOSIS — Z0000 Encounter for general adult medical examination without abnormal findings: Secondary | ICD-10-CM

## 2015-02-14 DIAGNOSIS — I10 Essential (primary) hypertension: Secondary | ICD-10-CM | POA: Diagnosis not present

## 2015-02-14 DIAGNOSIS — Z87442 Personal history of urinary calculi: Secondary | ICD-10-CM | POA: Diagnosis not present

## 2015-02-14 NOTE — Patient Instructions (Signed)
Limit your sodium (Salt) intake    It is important that you exercise regularly, at least 20 minutes 3 to 4 times per week.  If you develop chest pain or shortness of breath seek  medical attention.  You need to lose weight.  Consider a lower calorie diet and regular exercise.  Health Maintenance, Female Adopting a healthy lifestyle and getting preventive care can go a long way to promote health and wellness. Talk with your health care provider about what schedule of regular examinations is right for you. This is a good chance for you to check in with your provider about disease prevention and staying healthy. In between checkups, there are plenty of things you can do on your own. Experts have done a lot of research about which lifestyle changes and preventive measures are most likely to keep you healthy. Ask your health care provider for more information. WEIGHT AND DIET  Eat a healthy diet  Be sure to include plenty of vegetables, fruits, low-fat dairy products, and lean protein.  Do not eat a lot of foods high in solid fats, added sugars, or salt.  Get regular exercise. This is one of the most important things you can do for your health.  Most adults should exercise for at least 150 minutes each week. The exercise should increase your heart rate and make you sweat (moderate-intensity exercise).  Most adults should also do strengthening exercises at least twice a week. This is in addition to the moderate-intensity exercise.  Maintain a healthy weight  Body mass index (BMI) is a measurement that can be used to identify possible weight problems. It estimates body fat based on height and weight. Your health care provider can help determine your BMI and help you achieve or maintain a healthy weight.  For females 57 years of age and older:   A BMI below 18.5 is considered underweight.  A BMI of 18.5 to 24.9 is normal.  A BMI of 25 to 29.9 is considered overweight.  A BMI of 30 and  above is considered obese.  Watch levels of cholesterol and blood lipids  You should start having your blood tested for lipids and cholesterol at 47 years of age, then have this test every 5 years.  You may need to have your cholesterol levels checked more often if:  Your lipid or cholesterol levels are high.  You are older than 47 years of age.  You are at high risk for heart disease.  CANCER SCREENING   Lung Cancer  Lung cancer screening is recommended for adults 12-62 years old who are at high risk for lung cancer because of a history of smoking.  A yearly low-dose CT scan of the lungs is recommended for people who:  Currently smoke.  Have quit within the past 15 years.  Have at least a 30-pack-year history of smoking. A pack year is smoking an average of one pack of cigarettes a day for 1 year.  Yearly screening should continue until it has been 15 years since you quit.  Yearly screening should stop if you develop a health problem that would prevent you from having lung cancer treatment.  Breast Cancer  Practice breast self-awareness. This means understanding how your breasts normally appear and feel.  It also means doing regular breast self-exams. Let your health care provider know about any changes, no matter how small.  If you are in your 20s or 30s, you should have a clinical breast exam (CBE) by a health  care provider every 1-3 years as part of a regular health exam.  If you are 34 or older, have a CBE every year. Also consider having a breast X-ray (mammogram) every year.  If you have a family history of breast cancer, talk to your health care provider about genetic screening.  If you are at high risk for breast cancer, talk to your health care provider about having an MRI and a mammogram every year.  Breast cancer gene (BRCA) assessment is recommended for women who have family members with BRCA-related cancers. BRCA-related cancers  include:  Breast.  Ovarian.  Tubal.  Peritoneal cancers.  Results of the assessment will determine the need for genetic counseling and BRCA1 and BRCA2 testing. Cervical Cancer Your health care provider may recommend that you be screened regularly for cancer of the pelvic organs (ovaries, uterus, and vagina). This screening involves a pelvic examination, including checking for microscopic changes to the surface of your cervix (Pap test). You may be encouraged to have this screening done every 3 years, beginning at age 21.  For women ages 89-65, health care providers may recommend pelvic exams and Pap testing every 3 years, or they may recommend the Pap and pelvic exam, combined with testing for human papilloma virus (HPV), every 5 years. Some types of HPV increase your risk of cervical cancer. Testing for HPV may also be done on women of any age with unclear Pap test results.  Other health care providers may not recommend any screening for nonpregnant women who are considered low risk for pelvic cancer and who do not have symptoms. Ask your health care provider if a screening pelvic exam is right for you.  If you have had past treatment for cervical cancer or a condition that could lead to cancer, you need Pap tests and screening for cancer for at least 20 years after your treatment. If Pap tests have been discontinued, your risk factors (such as having a new sexual partner) need to be reassessed to determine if screening should resume. Some women have medical problems that increase the chance of getting cervical cancer. In these cases, your health care provider may recommend more frequent screening and Pap tests. Colorectal Cancer  This type of cancer can be detected and often prevented.  Routine colorectal cancer screening usually begins at 47 years of age and continues through 47 years of age.  Your health care provider may recommend screening at an earlier age if you have risk factors for  colon cancer.  Your health care provider may also recommend using home test kits to check for hidden blood in the stool.  A small camera at the end of a tube can be used to examine your colon directly (sigmoidoscopy or colonoscopy). This is done to check for the earliest forms of colorectal cancer.  Routine screening usually begins at age 36.  Direct examination of the colon should be repeated every 5-10 years through 47 years of age. However, you may need to be screened more often if early forms of precancerous polyps or small growths are found. Skin Cancer  Check your skin from head to toe regularly.  Tell your health care provider about any new moles or changes in moles, especially if there is a change in a mole's shape or color.  Also tell your health care provider if you have a mole that is larger than the size of a pencil eraser.  Always use sunscreen. Apply sunscreen liberally and repeatedly throughout the day.  Protect  yourself by wearing long sleeves, pants, a wide-brimmed hat, and sunglasses whenever you are outside. HEART DISEASE, DIABETES, AND HIGH BLOOD PRESSURE   High blood pressure causes heart disease and increases the risk of stroke. High blood pressure is more likely to develop in:  People who have blood pressure in the high end of the normal range (130-139/85-89 mm Hg).  People who are overweight or obese.  People who are African American.  If you are 54-65 years of age, have your blood pressure checked every 3-5 years. If you are 81 years of age or older, have your blood pressure checked every year. You should have your blood pressure measured twice--once when you are at a hospital or clinic, and once when you are not at a hospital or clinic. Record the average of the two measurements. To check your blood pressure when you are not at a hospital or clinic, you can use:  An automated blood pressure machine at a pharmacy.  A home blood pressure monitor.  If you  are between 81 years and 18 years old, ask your health care provider if you should take aspirin to prevent strokes.  Have regular diabetes screenings. This involves taking a blood sample to check your fasting blood sugar level.  If you are at a normal weight and have a low risk for diabetes, have this test once every three years after 47 years of age.  If you are overweight and have a high risk for diabetes, consider being tested at a younger age or more often. PREVENTING INFECTION  Hepatitis B  If you have a higher risk for hepatitis B, you should be screened for this virus. You are considered at high risk for hepatitis B if:  You were born in a country where hepatitis B is common. Ask your health care provider which countries are considered high risk.  Your parents were born in a high-risk country, and you have not been immunized against hepatitis B (hepatitis B vaccine).  You have HIV or AIDS.  You use needles to inject street drugs.  You live with someone who has hepatitis B.  You have had sex with someone who has hepatitis B.  You get hemodialysis treatment.  You take certain medicines for conditions, including cancer, organ transplantation, and autoimmune conditions. Hepatitis C  Blood testing is recommended for:  Everyone born from [birth year] through 1963.  Anyone with known risk factors for hepatitis C. Sexually transmitted infections (STIs)  You should be screened for sexually transmitted infections (STIs) including gonorrhea and chlamydia if:  You are sexually active and are younger than 47 years of age.  You are older than 47 years of age and your health care provider tells you that you are at risk for this type of infection.  Your sexual activity has changed since you were last screened and you are at an increased risk for chlamydia or gonorrhea. Ask your health care provider if you are at risk.  If you do not have HIV, but are at risk, it may be recommended that you  take a prescription medicine daily to prevent HIV infection. This is called pre-exposure prophylaxis (PrEP). You are considered at risk if:  You are sexually active and do not regularly use condoms or know the HIV status of your partner(s).  You take drugs by injection.  You are sexually active with a partner who has HIV. Talk with your health care provider about whether you are at high risk of being infected with  HIV. If you choose to begin PrEP, you should first be tested for HIV. You should then be tested every 3 months for as long as you are taking PrEP.  PREGNANCY   If you are premenopausal and you may become pregnant, ask your health care provider about preconception counseling.  If you may become pregnant, take 400 to 800 micrograms (mcg) of folic acid every day.  If you want to prevent pregnancy, talk to your health care provider about birth control (contraception). OSTEOPOROSIS AND MENOPAUSE   Osteoporosis is a disease in which the bones lose minerals and strength with aging. This can result in serious bone fractures. Your risk for osteoporosis can be identified using a bone density scan.  If you are 53 years of age or older, or if you are at risk for osteoporosis and fractures, ask your health care provider if you should be screened.  Ask your health care provider whether you should take a calcium or vitamin D supplement to lower your risk for osteoporosis.  Menopause may have certain physical symptoms and risks.  Hormone replacement therapy may reduce some of these symptoms and risks. Talk to your health care provider about whether hormone replacement therapy is right for you.  HOME CARE INSTRUCTIONS   Schedule regular health, dental, and eye exams.  Stay current with your immunizations.   Do not use any tobacco products including cigarettes, chewing tobacco, or electronic cigarettes.  If you are pregnant, do not drink alcohol.  If you are breastfeeding, limit how  much and how often you drink alcohol.  Limit alcohol intake to no more than 1 drink per day for nonpregnant women. One drink equals 12 ounces of beer, 5 ounces of wine, or 1 ounces of hard liquor.  Do not use street drugs.  Do not share needles.  Ask your health care provider for help if you need support or information about quitting drugs.  Tell your health care provider if you often feel depressed.  Tell your health care provider if you have ever been abused or do not feel safe at home.   This information is not intended to replace advice given to you by your health care provider. Make sure you discuss any questions you have with your health care provider.   Document Released: 09/07/2010 Document Revised: 03/15/2014 Document Reviewed: 01/24/2013 Elsevier Interactive Patient Education [employer].

## 2015-02-14 NOTE — Progress Notes (Signed)
Subjective:    Patient ID: Tanya [name], female    DOB: Jul 24, 1965, 49 y.o.   MRN: [id number]  HPI 49 -year-old patient who is in today for a health maintenance examination.   She is followed annually by gynecology.  Hormone replacement therapy has been discontinued due to elevated blood pressure readings.  She does have a long history of hypertension, controlled on dual therapy.  Ace inhibition has recently discontinued due to an episode of angioedema.  Doing well on present regimen   BP Readings from Last 3 Encounters:  12/25/14 132/80  12/20/14 108/69  08/08/14 110/82    Wt Readings from Last 3 Encounters:  12/25/14 210 lb 12.8 oz (95.618 kg)  12/20/14 202 lb (91.627 kg)  08/08/14 218 lb (98.884 kg)    Past Medical History  Diagnosis Date  . ELEVATED BP READING WITHOUT DX HYPERTENSION 10/14/2009  . HYPERTENSION 10/28/2009  . NEPHROLITHIASIS, HX OF 12/21/2007  . Renal colic 12/21/2007    Social History   Social History  . Marital Status: Single    Spouse Name: N/A  . Number of Children: N/A  . Years of Education: N/A   Occupational History  . Not on file.   Social History Main Topics  . Smoking status: Never Smoker   . Smokeless tobacco: Never Used  . Alcohol Use: 0.0 oz/week    0 Standard drinks or equivalent per week     Comment: rare  . Drug Use: No  . Sexual Activity: Yes    Birth Control/ Protection: Pill     Comment: INTERCOURSE AGE 57, SEXUAL PARTNERS LESS THAN 5   Other Topics Concern  . Not on file   Social History Narrative    Past Surgical History  Procedure Laterality Date  . Mouth surgery  1984  . Colposcopy    . Gynecologic cryosurgery      Family History  Problem Relation Age of Onset  . Arthritis Neg Hx     family hx  . Diabetes Mother   . Diabetes Maternal Aunt   . Hypertension Maternal Grandmother   . Cancer Maternal Grandmother     MGGM-Uterine cancer    Allergies  Allergen Reactions  . Lisinopril Swelling   Angioedema of lip see ed visit 101 6    Current Outpatient Prescriptions on File Prior to Visit  Medication Sig Dispense Refill  . amLODipine (NORVASC) 5 MG tablet TAKE 1 TABLET (5 MG TOTAL) BY MOUTH DAILY. 90 tablet 1  . benzonatate (TESSALON) 100 MG capsule Take 1 capsule (100 mg total) by mouth 3 (three) times daily as needed for cough. 24 capsule 1  . hydrochlorothiazide (HYDRODIURIL) 25 MG tablet Take 1 tablet (25 mg total) by mouth daily. (Patient not taking: Reported on 12/25/2014) 90 tablet 1  . HYDROcodone-homatropine (HYCODAN) 5-1.5 MG/5ML syrup Take 5 mLs by mouth every 4 (four) hours as needed for cough. 180 mL 0  . loratadine (CLARITIN) 10 MG tablet Take 10 mg by mouth daily as needed.     . Multiple Vitamin (MULTIVITAMIN) capsule Take 1 capsule by mouth daily.      . norethindrone-ethinyl estradiol (JUNEL FE,GILDESS FE,LOESTRIN FE) 1-20 MG-MCG tablet Take 1 tablet by mouth daily. 3 Package 4   No current facility-administered medications on file prior to visit.    There were no vitals taken for this visit.     Review of Systems  Constitutional: Negative.   HENT: Negative for congestion, dental problem, hearing loss, rhinorrhea, sinus pressure, sore  throat and tinnitus.   Eyes: Negative for pain, discharge and visual disturbance.  Respiratory: Negative for cough and shortness of breath.   Cardiovascular: Positive for palpitations. Negative for chest pain and leg swelling.  Gastrointestinal: Negative for nausea, vomiting, abdominal pain, diarrhea, constipation, blood in stool and abdominal distention.  Genitourinary: Negative for dysuria, urgency, frequency, hematuria, flank pain, vaginal bleeding, vaginal discharge, difficulty urinating, vaginal pain and pelvic pain.  Musculoskeletal: Negative for joint swelling, arthralgias and gait problem.  Skin: Negative for rash.  Neurological: Negative for dizziness, syncope, speech difficulty, weakness, numbness and headaches.    Hematological: Negative for adenopathy.  Psychiatric/Behavioral: Negative for behavioral problems, dysphoric mood and agitation. The patient is not nervous/anxious.        Objective:   Physical Exam  Constitutional: She is oriented to person, place, and time. She appears well-developed and well-nourished.  Weight Blood pressure 122/70  HENT:  Head: Normocephalic.  Right Ear: External ear normal.  Left Ear: External ear normal.  Mouth/Throat: Oropharynx is clear and moist.  Pharyngeal crowding  Eyes: Conjunctivae and EOM are normal. Pupils are equal, round, and reactive to light.  Neck: Normal range of motion. Neck supple. No thyromegaly present.  Cardiovascular: Normal rate, regular rhythm, normal heart sounds and intact distal pulses.   Frequent ectopics  Pulmonary/Chest: Effort normal and breath sounds normal.  Abdominal: Soft. Bowel sounds are normal. She exhibits no mass. There is no tenderness.  Musculoskeletal: Normal range of motion.  Lymphadenopathy:    She has no cervical adenopathy.  Neurological: She is alert and oriented to person, place, and time.  Skin: Skin is warm and dry. No rash noted.  Psychiatric: She has a normal mood and affect. Her behavior is normal.          Assessment & Plan:  Preventive health examination.  Weight loss encouraged Hypertension. Well-controlled.  No change in therapy  Obesity weight loss encouraged.  She has rejoined a health club exercise regimen.  Encouraged

## 2015-02-14 NOTE — Progress Notes (Signed)
Pre visit review using our clinic review tool, if applicable. No additional management support is needed unless otherwise documented below in the visit note. 

## 2015-02-17 ENCOUNTER — Ambulatory Visit
Admission: RE | Admit: 2015-02-17 | Discharge: 2015-02-17 | Disposition: A | Discharge status: Home / Self Care | Payer: Managed Care, Other (non HMO) | Source: Ambulatory Visit

## 2015-02-17 DIAGNOSIS — Z1231 Encounter for screening mammogram for malignant neoplasm of breast: Secondary | ICD-10-CM

## 2015-02-20 ENCOUNTER — Encounter: Payer: Self-pay | Admitting: Women's Health

## 2015-03-28 ENCOUNTER — Ambulatory Visit (INDEPENDENT_AMBULATORY_CARE_PROVIDER_SITE_OTHER): Payer: Managed Care, Other (non HMO) | Admitting: Internal Medicine

## 2015-03-28 ENCOUNTER — Encounter: Payer: Self-pay | Admitting: Internal Medicine

## 2015-03-28 VITALS — BP 150/80 | HR 114 | Temp 98.3°F | Resp 20 | Ht 63.0 in | Wt 216.0 lb

## 2015-03-28 DIAGNOSIS — B9789 Other viral agents as the cause of diseases classified elsewhere: Principal | ICD-10-CM

## 2015-03-28 DIAGNOSIS — J069 Acute upper respiratory infection, unspecified: Secondary | ICD-10-CM | POA: Diagnosis not present

## 2015-03-28 MED ORDER — PREDNISONE 20 MG PO TABS: 20.0000 mg | ORAL_TABLET | Freq: Two times a day (BID) | ORAL | Status: DC

## 2015-03-28 MED ORDER — BENZONATATE 100 MG PO CAPS: 100.0000 mg | ORAL_CAPSULE | Freq: Three times a day (TID) | ORAL | Status: DC | PRN

## 2015-03-28 MED ORDER — HYDROCODONE-HOMATROPINE 5-1.5 MG/5ML PO SYRP: 5.0000 mL | ORAL_SOLUTION | ORAL | Status: DC | PRN

## 2015-03-28 NOTE — Patient Instructions (Addendum)
Acute bronchitis symptoms  are generally not helped by antibiotics.  Take over-the-counter expectorants and cough medications such as  Mucinex DM.  Call if there is no improvement in 5 to 7 days or if  you develop worsening cough, fever, or new symptoms, such as shortness of breath or chest pain.  Prednisone 20 mg twice daily  HOME CARE INSTRUCTIONS  Drink plenty of water. Water helps thin the mucus so your sinuses can drain more easily.  Use a humidifier.  Inhale steam 3-4 times a day (for example, sit in the bathroom with the shower running).  Apply a warm, moist washcloth to your face 3-4 times a day, or as directed by your health care provider.  Use saline nasal sprays to help moisten and clean your sinuses.

## 2015-03-28 NOTE — Progress Notes (Signed)
Pre visit review using our clinic review tool, if applicable. No additional management support is needed unless otherwise documented below in the visit note. 

## 2015-03-28 NOTE — Progress Notes (Signed)
Subjective:    Patient ID: Tanya Richard, female    DOB: May 12, 1965, 50 y.o.   MRN: [id number]  HPI  50 year old patient who has treated hypertension.  The patient had a URI in October and was slow to improve with persistent cough.  Approximately 2 weeks ago.  The patient had onset of recurrent cough.  There is been minimal sputum production.  No fever.  Chest x-ray in October was unremarkable.  Cough is severe incessant and interferes with sleep.  Patient has been chronically fatigued.  No fever, wheezing or chest pain  Past Medical History  Diagnosis Date  . ELEVATED BP READING WITHOUT DX HYPERTENSION 10/14/2009  . HYPERTENSION 10/28/2009  . NEPHROLITHIASIS, HX OF 12/21/2007  . Renal colic 12/21/2007    Social History   Social History  . Marital Status: Single    Spouse Name: N/A  . Number of Children: N/A  . Years of Education: N/A   Occupational History  . Not on file.   Social History Main Topics  . Smoking status: Never Smoker   . Smokeless tobacco: Never Used  . Alcohol Use: 0.0 oz/week    0 Standard drinks or equivalent per week     Comment: rare  . Drug Use: No  . Sexual Activity: Yes    Birth Control/ Protection: Pill     Comment: INTERCOURSE AGE 40, SEXUAL PARTNERS LESS THAN 5   Other Topics Concern  . Not on file   Social History Narrative    Past Surgical History  Procedure Laterality Date  . Mouth surgery  1984  . Colposcopy    . Gynecologic cryosurgery      Family History  Problem Relation Age of Onset  . Arthritis Neg Hx     family hx  . Diabetes Mother   . Diabetes Maternal Aunt   . Hypertension Maternal Grandmother   . Cancer Maternal Grandmother     MGGM-Uterine cancer    Allergies  Allergen Reactions  . Lisinopril Swelling    Angioedema of lip see ed visit 101 6    Current Outpatient Prescriptions on File Prior to Visit  Medication Sig Dispense Refill  . amLODipine (NORVASC) 5 MG tablet TAKE 1 TABLET (5 MG TOTAL) BY MOUTH  DAILY. 90 tablet 1  . hydrochlorothiazide (HYDRODIURIL) 25 MG tablet Take 1 tablet (25 mg total) by mouth daily. 90 tablet 1  . loratadine (CLARITIN) 10 MG tablet Take 10 mg by mouth daily as needed.     . Multiple Vitamin (MULTIVITAMIN) capsule Take 1 capsule by mouth daily.      . norethindrone-ethinyl estradiol (JUNEL FE,GILDESS FE,LOESTRIN FE) 1-20 MG-MCG tablet Take 1 tablet by mouth daily. 3 Package 4   No current facility-administered medications on file prior to visit.    BP 150/80 mmHg  Pulse 114  Temp(Src) 98.3 F (36.8 C) (Oral)  Resp 20  Ht  (1.6 m)  Wt 216 lb (97.977 kg)  BMI 38.27 kg/m2  SpO2 99%     Review of Systems  Constitutional: Positive for activity change, appetite change and fatigue.  HENT: Negative for congestion, dental problem, hearing loss, rhinorrhea, sinus pressure, sore throat and tinnitus.   Eyes: Negative for pain, discharge and visual disturbance.  Respiratory: Positive for cough. Negative for shortness of breath.   Cardiovascular: Negative for chest pain, palpitations and leg swelling.  Gastrointestinal: Negative for nausea, vomiting, abdominal pain, diarrhea, constipation, blood in stool and abdominal distention.  Genitourinary: Negative for dysuria, urgency, frequency,  hematuria, flank pain, vaginal bleeding, vaginal discharge, difficulty urinating, vaginal pain and pelvic pain.  Musculoskeletal: Negative for joint swelling, arthralgias and gait problem.  Skin: Negative for rash.  Neurological: Negative for dizziness, syncope, speech difficulty, weakness, numbness and headaches.  Hematological: Negative for adenopathy.  Psychiatric/Behavioral: Positive for sleep disturbance. Negative for behavioral problems, dysphoric mood and agitation. The patient is not nervous/anxious.        Objective:   Physical Exam  Constitutional: She is oriented to person, place, and time. She appears well-developed and well-nourished.  HENT:  Head:  Normocephalic.  Right Ear: External ear normal.  Left Ear: External ear normal.  Mouth/Throat: Oropharynx is clear and moist.  Eyes: Conjunctivae and EOM are normal. Pupils are equal, round, and reactive to light.  Neck: Normal range of motion. Neck supple. No thyromegaly present.  Cardiovascular: Normal rate, regular rhythm, normal heart sounds and intact distal pulses.   Pulmonary/Chest: Effort normal and breath sounds normal. No respiratory distress. She has no wheezes. She has no rales.  O2 saturation 99%  Abdominal: Soft. Bowel sounds are normal. She exhibits no mass. There is no tenderness.  Musculoskeletal: Normal range of motion.  Lymphadenopathy:    She has no cervical adenopathy.  Neurological: She is alert and oriented to person, place, and time.  Skin: Skin is warm and dry. No rash noted.  Psychiatric: She has a normal mood and affect. Her behavior is normal.          Assessment & Plan:  Viral URI with cough. Symptoms are quite severe and have been persistent in the past.  Will treat with 6 days of oral prednisone and also treated with antitussives.

## 2015-06-30 ENCOUNTER — Other Ambulatory Visit: Payer: Self-pay | Admitting: Internal Medicine

## 2015-08-18 ENCOUNTER — Other Ambulatory Visit: Payer: Self-pay | Admitting: Internal Medicine

## 2015-08-18 NOTE — Telephone Encounter (Signed)
Refill sent to pharmacy.   

## 2015-09-02 ENCOUNTER — Other Ambulatory Visit: Payer: Self-pay | Admitting: Women's Health

## 2015-09-02 ENCOUNTER — Encounter: Payer: Self-pay | Admitting: Women's Health

## 2015-09-02 ENCOUNTER — Ambulatory Visit (INDEPENDENT_AMBULATORY_CARE_PROVIDER_SITE_OTHER): Payer: Managed Care, Other (non HMO) | Admitting: Women's Health

## 2015-09-02 VITALS — BP 126/78 | Ht 64.0 in | Wt 218.0 lb

## 2015-09-02 DIAGNOSIS — B3731 Acute candidiasis of vulva and vagina: Secondary | ICD-10-CM

## 2015-09-02 DIAGNOSIS — Z3041 Encounter for surveillance of contraceptive pills: Secondary | ICD-10-CM | POA: Diagnosis not present

## 2015-09-02 DIAGNOSIS — B373 Candidiasis of vulva and vagina: Secondary | ICD-10-CM

## 2015-09-02 DIAGNOSIS — Z01419 Encounter for gynecological examination (general) (routine) without abnormal findings: Secondary | ICD-10-CM | POA: Diagnosis not present

## 2015-09-02 DIAGNOSIS — Z833 Family history of diabetes mellitus: Secondary | ICD-10-CM

## 2015-09-02 MED ORDER — NORETHIN ACE-ETH ESTRAD-FE 1-20 MG-MCG PO TABS: 1.0000 | ORAL_TABLET | Freq: Every day | ORAL | Status: DC

## 2015-09-02 MED ORDER — FLUCONAZOLE 150 MG PO TABS: 150.0000 mg | ORAL_TABLET | Freq: Once | ORAL | Status: DC

## 2015-09-02 NOTE — Patient Instructions (Signed)
Health Maintenance, Female Adopting a healthy lifestyle and getting preventive care can go a long way to promote health and wellness. Talk with your health care provider about what schedule of regular examinations is right for you. This is a good chance for you to check in with your provider about disease prevention and staying healthy. In between checkups, there are plenty of things you can do on your own. Experts have done a lot of research about which lifestyle changes and preventive measures are most likely to keep you healthy. Ask your health care provider for more information. WEIGHT AND DIET  Eat a healthy diet  Be sure to include plenty of vegetables, fruits, low-fat dairy products, and lean protein.  Do not eat a lot of foods high in solid fats, added sugars, or salt.  Get regular exercise. This is one of the most important things you can do for your health.  Most adults should exercise for at least 150 minutes each week. The exercise should increase your heart rate and make you sweat (moderate-intensity exercise).  Most adults should also do strengthening exercises at least twice a week. This is in addition to the moderate-intensity exercise.  Maintain a healthy weight  Body mass index (BMI) is a measurement that can be used to identify possible weight problems. It estimates body fat based on height and weight. Your health care provider can help determine your BMI and help you achieve or maintain a healthy weight.  For females 20 years of age and older:   A BMI below 18.5 is considered underweight.  A BMI of 18.5 to 24.9 is normal.  A BMI of 25 to 29.9 is considered overweight.  A BMI of 30 and above is considered obese.  Watch levels of cholesterol and blood lipids  You should start having your blood tested for lipids and cholesterol at 48 years of age, then have this test every 5 years.  You may need to have your cholesterol levels checked more often if:  Your lipid  or cholesterol levels are high.  You are older than 48 years of age.  You are at high risk for heart disease.  CANCER SCREENING   Lung Cancer  Lung cancer screening is recommended for adults 55-80 years old who are at high risk for lung cancer because of a history of smoking.  A yearly low-dose CT scan of the lungs is recommended for people who:  Currently smoke.  Have quit within the past 15 years.  Have at least a 30-pack-year history of smoking. A pack year is smoking an average of one pack of cigarettes a day for 1 year.  Yearly screening should continue until it has been 15 years since you quit.  Yearly screening should stop if you develop a health problem that would prevent you from having lung cancer treatment.  Breast Cancer  Practice breast self-awareness. This means understanding how your breasts normally appear and feel.  It also means doing regular breast self-exams. Let your health care provider know about any changes, no matter how small.  If you are in your 20s or 30s, you should have a clinical breast exam (CBE) by a health care provider every 1-3 years as part of a regular health exam.  If you are 40 or older, have a CBE every year. Also consider having a breast X-ray (mammogram) every year.  If you have a family history of breast cancer, talk to your health care provider about genetic screening.  If you   are at high risk for breast cancer, talk to your health care provider about having an MRI and a mammogram every year.  Breast cancer gene (BRCA) assessment is recommended for women who have family members with BRCA-related cancers. BRCA-related cancers include:  Breast.  Ovarian.  Tubal.  Peritoneal cancers.  Results of the assessment will determine the need for genetic counseling and BRCA1 and BRCA2 testing. Cervical Cancer Your health care provider may recommend that you be screened regularly for cancer of the pelvic organs (ovaries, uterus, and  vagina). This screening involves a pelvic examination, including checking for microscopic changes to the surface of your cervix (Pap test). You may be encouraged to have this screening done every 3 years, beginning at age 21.  For women ages 30-65, health care providers may recommend pelvic exams and Pap testing every 3 years, or they may recommend the Pap and pelvic exam, combined with testing for human papilloma virus (HPV), every 5 years. Some types of HPV increase your risk of cervical cancer. Testing for HPV may also be done on women of any age with unclear Pap test results.  Other health care providers may not recommend any screening for nonpregnant women who are considered low risk for pelvic cancer and who do not have symptoms. Ask your health care provider if a screening pelvic exam is right for you.  If you have had past treatment for cervical cancer or a condition that could lead to cancer, you need Pap tests and screening for cancer for at least 20 years after your treatment. If Pap tests have been discontinued, your risk factors (such as having a new sexual partner) need to be reassessed to determine if screening should resume. Some women have medical problems that increase the chance of getting cervical cancer. In these cases, your health care provider may recommend more frequent screening and Pap tests. Colorectal Cancer  This type of cancer can be detected and often prevented.  Routine colorectal cancer screening usually begins at 48 years of age and continues through 48 years of age.  Your health care provider may recommend screening at an earlier age if you have risk factors for colon cancer.  Your health care provider may also recommend using home test kits to check for hidden blood in the stool.  A small camera at the end of a tube can be used to examine your colon directly (sigmoidoscopy or colonoscopy). This is done to check for the earliest forms of colorectal  cancer.  Routine screening usually begins at age 50.  Direct examination of the colon should be repeated every 5-10 years through 48 years of age. However, you may need to be screened more often if early forms of precancerous polyps or small growths are found. Skin Cancer  Check your skin from head to toe regularly.  Tell your health care provider about any new moles or changes in moles, especially if there is a change in a mole's shape or color.  Also tell your health care provider if you have a mole that is larger than the size of a pencil eraser.  Always use sunscreen. Apply sunscreen liberally and repeatedly throughout the day.  Protect yourself by wearing long sleeves, pants, a wide-brimmed hat, and sunglasses whenever you are outside. HEART DISEASE, DIABETES, AND HIGH BLOOD PRESSURE   High blood pressure causes heart disease and increases the risk of stroke. High blood pressure is more likely to develop in:  People who have blood pressure in the high end   of the normal range (130-139/85-89 mm Hg).  People who are overweight or obese.  People who are African American.  If you are 38-23 years of age, have your blood pressure checked every 3-5 years. If you are 61 years of age or older, have your blood pressure checked every year. You should have your blood pressure measured twice--once when you are at a hospital or clinic, and once when you are not at a hospital or clinic. Record the average of the two measurements. To check your blood pressure when you are not at a hospital or clinic, you can use:  An automated blood pressure machine at a pharmacy.  A home blood pressure monitor.  If you are between 45 years and 39 years old, ask your health care provider if you should take aspirin to prevent strokes.  Have regular diabetes screenings. This involves taking a blood sample to check your fasting blood sugar level.  If you are at a normal weight and have a low risk for diabetes,  have this test once every three years after 48 years of age.  If you are overweight and have a high risk for diabetes, consider being tested at a younger age or more often. PREVENTING INFECTION  Hepatitis B  If you have a higher risk for hepatitis B, you should be screened for this virus. You are considered at high risk for hepatitis B if:  You were born in a country where hepatitis B is common. Ask your health care provider which countries are considered high risk.  Your parents were born in a high-risk country, and you have not been immunized against hepatitis B (hepatitis B vaccine).  You have HIV or AIDS.  You use needles to inject street drugs.  You live with someone who has hepatitis B.  You have had sex with someone who has hepatitis B.  You get hemodialysis treatment.  You take certain medicines for conditions, including cancer, organ transplantation, and autoimmune conditions. Hepatitis C  Blood testing is recommended for:  Everyone born from [birth year] through 1963.  Anyone with known risk factors for hepatitis C. Sexually transmitted infections (STIs)  You should be screened for sexually transmitted infections (STIs) including gonorrhea and chlamydia if:  You are sexually active and are younger than 48 years of age.  You are older than 48 years of age and your health care provider tells you that you are at risk for this type of infection.  Your sexual activity has changed since you were last screened and you are at an increased risk for chlamydia or gonorrhea. Ask your health care provider if you are at risk.  If you do not have HIV, but are at risk, it may be recommended that you take a prescription medicine daily to prevent HIV infection. This is called pre-exposure prophylaxis (PrEP). You are considered at risk if:  You are sexually active and do not regularly use condoms or know the HIV status of your partner(s).  You take drugs by injection.  You are sexually  active with a partner who has HIV. Talk with your health care provider about whether you are at high risk of being infected with HIV. If you choose to begin PrEP, you should first be tested for HIV. You should then be tested every 3 months for as long as you are taking PrEP.  PREGNANCY   If you are premenopausal and you may become pregnant, ask your health care provider about preconception counseling.  If you may  become pregnant, take 400 to 800 micrograms (mcg) of folic acid every day.  If you want to prevent pregnancy, talk to your health care provider about birth control (contraception). OSTEOPOROSIS AND MENOPAUSE   Osteoporosis is a disease in which the bones lose minerals and strength with aging. This can result in serious bone fractures. Your risk for osteoporosis can be identified using a bone density scan.  If you are 61 years of age or older, or if you are at risk for osteoporosis and fractures, ask your health care provider if you should be screened.  Ask your health care provider whether you should take a calcium or vitamin D supplement to lower your risk for osteoporosis.  Menopause may have certain physical symptoms and risks.  Hormone replacement therapy may reduce some of these symptoms and risks. Talk to your health care provider about whether hormone replacement therapy is right for you.  HOME CARE INSTRUCTIONS   Schedule regular health, dental, and eye exams.  Stay current with your immunizations.   Do not use any tobacco products including cigarettes, chewing tobacco, or electronic cigarettes.  If you are pregnant, do not drink alcohol.  If you are breastfeeding, limit how much and how often you drink alcohol.  Limit alcohol intake to no more than 1 drink per day for nonpregnant women. One drink equals 12 ounces of beer, 5 ounces of wine, or 1 ounces of hard liquor.  Do not use street drugs.  Do not share needles.  Ask your health care provider for help if  you need support or information about quitting drugs.  Tell your health care provider if you often feel depressed.  Tell your health care provider if you have ever been abused or do not feel safe at home.   This information is not intended to replace advice given to you by your health care provider. Make sure you discuss any questions you have with your health care provider.   Document Released: 09/07/2010 Document Revised: 03/15/2014 Document Reviewed: 01/24/2013 Elsevier Interactive Patient Education [employer].

## 2015-09-02 NOTE — Progress Notes (Signed)
Tanya [name] 01/22/1966 [id number]   History:    Presents for annual exam.  Reports intermittent white discharge last three months.  Monthly 3-4 day cycles on OCPs, has back pain, headaches, leg aches with cycles but symptoms better on OCPs. Blood pressure has been stable while on OCs for years.  Not sexually active > 1 year.  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  Normal mammogram history.  Unable to exercise due to pain from torn ligament in right foot, wearing boot and will return to Orthopedist in July for follow-up.  Hypertension well-controlled on amlodipine and hydrochlorothiazide, managed by primary care physician.    Past medical history, past surgical history, family history and social history were all reviewed and documented in the EPIC chart.   Works for [employer].  FH of parents hypertension, Diabetes (mother, maternal uncle).  ROS:  A ROS was performed and pertinent positives and negatives are included.  Exam:  Filed Vitals:   09/02/15 1434  BP: 126/78    General appearance:  Normal, well-appearing. Thyroid:  Symmetrical, normal in size, without palpable masses or nodularity. Respiratory  Auscultation:  Clear without wheezing or rhonchi Cardiovascular  Auscultation:  Regular rate, without rubs, murmurs or gallops  Edema/varicosities:  Not grossly evident Abdominal  Soft,nontender, without masses, guarding or rebound.  Liver/spleen:  No organomegaly noted  Hernia:  None appreciated  Skin  Inspection:  Grossly normal.  Bruising bilateral shoulders from bra straps.  Round scar left breast.  Discoloration from healing superficial lesion under right breast.   Breasts: Examined lying and sitting/Pendulous.      Right: Without masses, retractions, discharge or axillary adenopathy.     Left: Without masses, retractions, discharge or axillary adenopathy. Gentitourinary   Inguinal/mons:  Normal without inguinal adenopathy  External genitalia:  Normal  BUS/Urethra/Skene's  glands:  Normal  Vagina:  Normal, no erythema or discharge present.  Cervix:  Normal  Uterus:  Normal in size, shape and contour.  Midline and mobile  Adnexa/parametria:     Rt: Without masses or tenderness.   Lt: Without masses or tenderness.  Anus and perineum: Normal  Digital rectal exam: Normal sphincter tone without palpated masses or tenderness  Assessment/Plan:  50 y.o. SBF G0 for annual exam with complaint of intermittent discharge x723months and  increase in "boils" skin lesions/infections over the past 6 months.    Monthly cycles on OCPs Obesity-stable Hypertension - managed by PCP - labs and meds Torn ligament right foot, managed by Orthopedist Upper back/neck and shoulder discomfort from pendulous breasts  Plan: SBE's, encouraged 3D mammogram  history of dense breasts.  Encouraged healthy diet, seated exercises while foot heals.  Discussed ways to cut calories, Weight Watchers.  Junel FE 1-20mg -mcg prescribed, reviewed risks for clots and stroke.  Diflucan 150mg  x1dose prescribed if discharge occurs.  UA, HgA1C, Pap , Pap normal 2016, patient requested annual Pap screening, new screening guidelines reviewed. Keep follow-up with primary care to evaluate increased skin infections.   Harrington ChallengerYOUNG,Tanya Moor J [facility] SewickleyWHNP, 3:22 PM 09/02/2015

## 2015-09-03 LAB — URINALYSIS W MICROSCOPIC + REFLEX CULTURE
Bacteria, UA: NONE SEEN [HPF]
Bilirubin Urine: NEGATIVE
Casts: NONE SEEN [LPF]
Glucose, UA: NEGATIVE
Ketones, ur: NEGATIVE
Leukocytes, UA: NEGATIVE
Nitrite: NEGATIVE
Protein, ur: NEGATIVE
Specific Gravity, Urine: 1.029 (ref 1.001–1.035)
Yeast: NONE SEEN [HPF]
pH: 6.5 (ref 5.0–8.0)

## 2015-09-03 LAB — PAP IG W/ RFLX HPV ASCU

## 2015-09-03 LAB — HEMOGLOBIN A1C
Hgb A1c MFr Bld: 6.3 % — ABNORMAL HIGH (ref <5.7)
Mean Plasma Glucose: 134 mg/dL

## 2015-09-03 NOTE — Addendum Note (Signed)
Addended by: Kem ParkinsonBARNES, Caraline Deutschman on: 09/03/2015 04:18 PM   Modules accepted: Orders, SmartSet

## 2015-09-04 LAB — PAP, TP IMAGING W/ HPV RNA, RFLX HPV TYPE 16,18/45: HPV mRNA, High Risk: NOT DETECTED

## 2015-09-05 LAB — URINE CULTURE: Colony Count: 10000

## 2015-10-10 ENCOUNTER — Telehealth: Payer: Self-pay | Admitting: *Deleted

## 2015-10-10 MED ORDER — NAPROXEN SODIUM 550 MG PO TABS: 550.0000 mg | ORAL_TABLET | Freq: Two times a day (BID) | ORAL | 0 refills | Status: DC

## 2015-10-10 NOTE — Telephone Encounter (Signed)
Okay for refill?  

## 2015-10-10 NOTE — Telephone Encounter (Signed)
Rx sent 

## 2015-10-10 NOTE — Telephone Encounter (Signed)
Pt was seen for annual on 09/02/15 and forgot to ask if you would refill her Naproxen sodium 550 mg, last filled in June 2016. States she takes for cramps. Please advise

## 2016-01-21 ENCOUNTER — Other Ambulatory Visit: Payer: Self-pay | Admitting: Internal Medicine

## 2016-02-03 ENCOUNTER — Other Ambulatory Visit: Payer: Self-pay | Admitting: Women's Health

## 2016-02-03 DIAGNOSIS — Z1231 Encounter for screening mammogram for malignant neoplasm of breast: Secondary | ICD-10-CM

## 2016-02-11 ENCOUNTER — Other Ambulatory Visit (INDEPENDENT_AMBULATORY_CARE_PROVIDER_SITE_OTHER): Payer: Managed Care, Other (non HMO)

## 2016-02-11 DIAGNOSIS — Z0000 Encounter for general adult medical examination without abnormal findings: Secondary | ICD-10-CM

## 2016-02-11 LAB — CBC WITH DIFFERENTIAL/PLATELET
BASOS ABS: 0 10*3/uL (ref 0.0–0.1)
Basophils Relative: 0.3 % (ref 0.0–3.0)
EOS ABS: 0.1 10*3/uL (ref 0.0–0.7)
Eosinophils Relative: 0.5 % (ref 0.0–5.0)
HCT: 34.6 % — ABNORMAL LOW (ref 36.0–46.0)
Hemoglobin: 11.6 g/dL — ABNORMAL LOW (ref 12.0–15.0)
LYMPHS ABS: 2.6 10*3/uL (ref 0.7–4.0)
Lymphocytes Relative: 24.6 % (ref 12.0–46.0)
MCHC: 33.5 g/dL (ref 30.0–36.0)
MCV: 85.2 fl (ref 78.0–100.0)
MONO ABS: 0.3 10*3/uL (ref 0.1–1.0)
MONOS PCT: 2.7 % — AB (ref 3.0–12.0)
NEUTROS PCT: 71.9 % (ref 43.0–77.0)
Neutro Abs: 7.7 10*3/uL (ref 1.4–7.7)
PLATELETS: 390 10*3/uL (ref 150.0–400.0)
RBC: 4.07 Mil/uL (ref 3.87–5.11)
RDW: 14.6 % (ref 11.5–15.5)
WBC: 10.8 10*3/uL — ABNORMAL HIGH (ref 4.0–10.5)

## 2016-02-11 LAB — HEPATIC FUNCTION PANEL
ALK PHOS: 91 U/L (ref 39–117)
ALT: 53 U/L — ABNORMAL HIGH (ref 0–35)
AST: 32 U/L (ref 0–37)
Albumin: 3.5 g/dL (ref 3.5–5.2)
BILIRUBIN DIRECT: 0.1 mg/dL (ref 0.0–0.3)
BILIRUBIN TOTAL: 0.4 mg/dL (ref 0.2–1.2)
Total Protein: 7 g/dL (ref 6.0–8.3)

## 2016-02-11 LAB — BASIC METABOLIC PANEL
BUN: 15 mg/dL (ref 6–23)
CALCIUM: 9 mg/dL (ref 8.4–10.5)
CO2: 29 mEq/L (ref 19–32)
CREATININE: 0.5 mg/dL (ref 0.40–1.20)
Chloride: 106 mEq/L (ref 96–112)
GFR: 169.28 mL/min (ref >60.00)
GLUCOSE: 97 mg/dL (ref 70–99)
Potassium: 3.4 mEq/L — ABNORMAL LOW (ref 3.5–5.1)
SODIUM: 143 meq/L (ref 135–145)

## 2016-02-11 LAB — LIPID PANEL
Cholesterol: 168 mg/dL (ref 0–200)
HDL: 49.3 mg/dL (ref >39.00)
LDL CALC: 105 mg/dL — AB (ref 0–99)
NONHDL: 118.84
Total CHOL/HDL Ratio: 3
Triglycerides: 68 mg/dL (ref 0.0–149.0)
VLDL: 13.6 mg/dL (ref 0.0–40.0)

## 2016-02-11 LAB — POC URINALSYSI DIPSTICK (AUTOMATED)
BILIRUBIN UA: NEGATIVE
GLUCOSE UA: NEGATIVE
KETONES UA: NEGATIVE
Nitrite, UA: NEGATIVE
PH UA: 6
Spec Grav, UA: 1.015
Urobilinogen, UA: 1

## 2016-02-11 LAB — TSH: TSH: 0.94 u[IU]/mL (ref 0.35–4.50)

## 2016-02-20 ENCOUNTER — Ambulatory Visit (INDEPENDENT_AMBULATORY_CARE_PROVIDER_SITE_OTHER): Payer: Managed Care, Other (non HMO) | Admitting: Internal Medicine

## 2016-02-20 ENCOUNTER — Encounter: Payer: Self-pay | Admitting: Internal Medicine

## 2016-02-20 DIAGNOSIS — Z0000 Encounter for general adult medical examination without abnormal findings: Secondary | ICD-10-CM

## 2016-02-20 NOTE — Progress Notes (Signed)
Pre visit review using our clinic review tool, if applicable. No additional management support is needed unless otherwise documented below in the visit note. 

## 2016-02-20 NOTE — Patient Instructions (Addendum)
Limit your sodium (Salt) intake  Please check your blood pressure on a regular basis.  If it is consistently greater than 150/90, please make an office appointment.    It is important that you exercise regularly, at least 20 minutes 3 to 4 times per week.  If you develop chest pain or shortness of breath seek  medical attention.  You need to lose weight.  Consider a lower calorie diet and regular exercise.  Schedule your mammogram.  Paresthesia Introduction Paresthesia is a burning or prickling feeling. This feeling can happen in any part of the body. It often happens in the hands, arms, legs, or feet. Usually, it is not painful. In most cases, the feeling goes away in a short time and is not a sign of a serious problem. Follow these instructions at home:  Avoid drinking alcohol.  Try massage or needle therapy (acupuncture) to help with your problems.  Keep all follow-up visits as told by your doctor. This is important. Contact a doctor if:  You keep on having episodes of paresthesia.  Your burning or prickling feeling gets worse when you walk.  You have pain or cramps.  You feel dizzy.  You have a rash. Get help right away if:  You feel weak.  You have trouble walking or moving.  You have problems speaking, understanding, or seeing.  You feel confused.  You cannot control when you pee (urinate) or poop (bowel movement).  You lose feeling (numbness) after an injury.  You pass out (faint). This information is not intended to replace advice given to you by your health care provider. Make sure you discuss any questions you have with your health care provider. Document Released: 02/05/2008 Document Revised: 07/31/2015 Document Reviewed: 02/18/2014  2017 Elsevier

## 2016-02-20 NOTE — Progress Notes (Signed)
Subjective:    Patient ID: Tanya Richard, female    DOB: [birth year], 50 y.o.   MRN: [id number]  HPI 50 year old patient who is seen today for a preventive health examination. She is followed annually by gynecology and is scheduled for a mammogram later this month. She has essential hypertension which has been well-controlled on dual therapy. She has mild obesity. Doing quite well. She does complain of some intermittent numbness of hands and feet over the past 4 months.  This affects the the right hand and left foot, more prominently.  Past Medical History:  Diagnosis Date  . ELEVATED BP READING WITHOUT DX HYPERTENSION 10/14/2009  . HYPERTENSION 10/28/2009  . NEPHROLITHIASIS, HX OF 12/21/2007  . Renal colic 12/21/2007     Social History   Social History  . Marital status: Single    Spouse name: N/A  . Number of children: N/A  . Years of education: N/A   Occupational History  . Not on file.   Social History Main Topics  . Smoking status: Never Smoker  . Smokeless tobacco: Never Used  . Alcohol use 0.0 oz/week     Comment: rare  . Drug use: No  . Sexual activity: Yes    Birth control/ protection: Pill     Comment: INTERCOURSE AGE 33, SEXUAL PARTNERS LESS THAN 5   Other Topics Concern  . Not on file   Social History Narrative  . No narrative on file    Past Surgical History:  Procedure Laterality Date  . COLPOSCOPY    . GYNECOLOGIC CRYOSURGERY    . MOUTH SURGERY  1984    Family History  Problem Relation Age of Onset  . Arthritis Neg Hx     family hx  . Diabetes Mother   . Diabetes Maternal Aunt   . Hypertension Maternal Grandmother   . Cancer Maternal Grandmother     MGGM-Uterine cancer    Allergies  Allergen Reactions  . Lisinopril Swelling    Angioedema of lip see ed visit [phone]    Current Outpatient Prescriptions on File Prior to Visit  Medication Sig Dispense Refill  . amLODipine (NORVASC) 5 MG tablet TAKE 1 TABLET (5 MG TOTAL) BY MOUTH DAILY.  90 tablet 1  . hydrochlorothiazide (HYDRODIURIL) 25 MG tablet TAKE 1 TABLET (25 MG TOTAL) BY MOUTH DAILY. 90 tablet 1  . loratadine (CLARITIN) 10 MG tablet Take 10 mg by mouth daily as needed.     . Multiple Vitamin (MULTIVITAMIN) capsule Take 1 capsule by mouth daily.      . naproxen sodium (ANAPROX) 550 MG tablet Take 1 tablet (550 mg total) by mouth 2 (two) times daily with a meal. 60 tablet 0  . norethindrone-ethinyl estradiol (JUNEL FE,GILDESS FE,LOESTRIN FE) 1-20 MG-MCG tablet Take 1 tablet by mouth daily. 3 Package 4   No current facility-administered medications on file prior to visit.     There were no vitals taken for this visit.     Review of Systems  Constitutional: Negative.   HENT: Negative for congestion, dental problem, hearing loss, rhinorrhea, sinus pressure, sore throat and tinnitus.   Eyes: Negative for pain, discharge and visual disturbance.  Respiratory: Negative for cough and shortness of breath.   Cardiovascular: Negative for chest pain, palpitations and leg swelling.  Gastrointestinal: Negative for abdominal distention, abdominal pain, blood in stool, constipation, diarrhea, nausea and vomiting.  Genitourinary: Negative for difficulty urinating, dysuria, flank pain, frequency, hematuria, pelvic pain, urgency, vaginal bleeding, vaginal discharge and vaginal pain.  Musculoskeletal: Negative for arthralgias, gait problem and joint swelling.  Skin: Negative for rash.  Neurological: Negative for dizziness, syncope, speech difficulty, weakness, numbness and headaches.  Hematological: Negative for adenopathy.  Psychiatric/Behavioral: Negative for agitation, behavioral problems and dysphoric mood. The patient is not nervous/anxious.        Objective:   Physical Exam  Constitutional: She is oriented to person, place, and time. She appears well-developed and well-nourished.  HENT:  Head: Normocephalic and atraumatic.  Right Ear: External ear normal.  Left Ear:  External ear normal.  Mouth/Throat: Oropharynx is clear and moist.  Eyes: Conjunctivae and EOM are normal.  Neck: Normal range of motion. Neck supple. No JVD present. No thyromegaly present.  Cardiovascular: Normal rate, regular rhythm, normal heart sounds and intact distal pulses.   No murmur heard. Pulmonary/Chest: Effort normal and breath sounds normal. She has no wheezes. She has no rales.  Abdominal: Soft. Bowel sounds are normal. She exhibits no distension and no mass. There is no tenderness. There is no rebound and no guarding.  Musculoskeletal: Normal range of motion. She exhibits no edema or tenderness.  Neurological: She is alert and oriented to person, place, and time. She has normal reflexes. No cranial nerve deficit. She exhibits normal muscle tone. Coordination normal.  Normal sensory exam  Skin: Skin is warm and dry. No rash noted.  Psychiatric: She has a normal mood and affect. Her behavior is normal.          Assessment & Plan:   Preventive health examination.  Mammogram later this month as scheduled Essential hypertension, well-controlled Nonspecific paresthesias.  We'll continue to observe Mild obesity weight loss encouraged  Follow-up 6 months  Sunita Demond Homero FellersFRANK

## 2016-03-02 ENCOUNTER — Ambulatory Visit
Admission: RE | Admit: 2016-03-02 | Discharge: 2016-03-02 | Disposition: A | Discharge status: Home / Self Care | Payer: Managed Care, Other (non HMO) | Source: Ambulatory Visit | Attending: Women's Health | Admitting: Women's Health

## 2016-03-02 DIAGNOSIS — Z1231 Encounter for screening mammogram for malignant neoplasm of breast: Secondary | ICD-10-CM

## 2016-03-05 ENCOUNTER — Other Ambulatory Visit: Payer: Self-pay | Admitting: Women's Health

## 2016-03-05 DIAGNOSIS — R928 Other abnormal and inconclusive findings on diagnostic imaging of breast: Secondary | ICD-10-CM

## 2016-03-12 ENCOUNTER — Ambulatory Visit
Admission: RE | Admit: 2016-03-12 | Discharge: 2016-03-12 | Disposition: A | Discharge status: Home / Self Care | Payer: Managed Care, Other (non HMO) | Source: Ambulatory Visit | Attending: Women's Health | Admitting: Women's Health

## 2016-03-12 DIAGNOSIS — R928 Other abnormal and inconclusive findings on diagnostic imaging of breast: Secondary | ICD-10-CM

## 2016-03-20 ENCOUNTER — Other Ambulatory Visit: Payer: Self-pay | Admitting: Internal Medicine

## 2016-03-29 ENCOUNTER — Other Ambulatory Visit: Payer: Self-pay | Admitting: Internal Medicine

## 2016-07-21 ENCOUNTER — Encounter: Payer: Self-pay | Admitting: Gynecology

## 2016-08-17 ENCOUNTER — Other Ambulatory Visit: Payer: Self-pay | Admitting: Internal Medicine

## 2016-08-23 ENCOUNTER — Other Ambulatory Visit: Payer: Self-pay | Admitting: Internal Medicine

## 2016-09-06 ENCOUNTER — Encounter: Payer: Self-pay | Admitting: Women's Health

## 2016-09-06 ENCOUNTER — Ambulatory Visit (INDEPENDENT_AMBULATORY_CARE_PROVIDER_SITE_OTHER): Payer: BLUE CROSS/BLUE SHIELD | Admitting: Women's Health

## 2016-09-06 VITALS — BP 120/72 | Ht 64.0 in | Wt 214.0 lb

## 2016-09-06 DIAGNOSIS — Z01419 Encounter for gynecological examination (general) (routine) without abnormal findings: Secondary | ICD-10-CM

## 2016-09-06 DIAGNOSIS — Z3041 Encounter for surveillance of contraceptive pills: Secondary | ICD-10-CM

## 2016-09-06 MED ORDER — NORETHIN ACE-ETH ESTRAD-FE 1-20 MG-MCG PO TABS: 1.0000 | ORAL_TABLET | Freq: Every day | ORAL | 4 refills | Status: DC

## 2016-09-06 NOTE — Patient Instructions (Signed)
Vit d 1000  17-Hydroxycorticosteroids Test Why am I having this test? This is a urine test that determines the level of steroids in the urine. Testing your urine is an indirect way for your health care provider to assess how your adrenal glands are working. What kind of sample is taken? A urine sample is collected in a sterile container that is given to you by the lab to use at home before the test. How do I collect samples at home? You will be asked to collect urine samples at home over a 24-hour time period. Follow your health care provider's instructions about how to collect the samples. How do I prepare for this test? Your health care provider may ask you to avoid the following things before and during the sample collection period:  Situations that cause you stress.  Eating licorice.  Taking certain medicines.  When collecting urine samples over a 24-hour period, make sure you:  Collect urine only in sterile containers provided to you by the lab.  Refrigerate all samples until they can be returned to the lab.  Do not contaminate samples with your stool. Urinate before having a bowel movement to avoid contamination.  Do not put toilet paper in the urine collection container.  What are the reference ranges? Reference ranges are considered healthy ranges established after testing a large group of healthy people. Reference ranges may vary among different people, labs, and hospitals. It is your responsibility to obtain your test results. Ask the lab or department performing the test when and how you will receive your results.  Adults: ? Female: 3-10 mg in 24 hours or 8.3-27.6 micromoles in a day (SI units). ? Female: 2-8 mg/24 hours or 5.2-22.1 micromoles in a day (SI units). ? Elderly: slightly lower than adult values.  Children: ? Less than 1 years of age: less than 1.5 mg in 24 hours. ? 50-38 years of age: less than 4.5 mg in 24 hours.  What do the results mean? Increased  levels may be related to the following:  Cushing disease or Cushing syndrome.  Tumors of the pituitary gland.  Hyperthyroidism.  Stress.  Obesity.  Decreased levels may be related to the following:  Certain syndromes of the adrenal gland.  Underactive pituitary gland.  Addison disease.  Hypothyroidism.  Talk with your health care provider to discuss your results, treatment options, and if necessary, the need for more tests. Talk with your health care provider if you have any questions about your results. This information is not intended to replace advice given to you by your health care provider. Make sure you discuss any questions you have with your health care provider. Document Released: 03/27/2004 Document Revised: 10/27/2015 Document Reviewed: 07/09/2013 Elsevier Interactive Patient Education  2018 [employer] Maintenance, Female Adopting a healthy lifestyle and getting preventive care can go a long way to promote health and wellness. Talk with your health care provider about what schedule of regular examinations is right for you. This is a good chance for you to check in with your provider about disease prevention and staying healthy. In between checkups, there are plenty of things you can do on your own. Experts have done a lot of research about which lifestyle changes and preventive measures are most likely to keep you healthy. Ask your health care provider for more information. Weight and diet Eat a healthy diet  Be sure to include plenty of vegetables, fruits, low-fat dairy products, and lean protein.  Do not eat a  lot of foods high in solid fats, added sugars, or salt.  Get regular exercise. This is one of the most important things you can do for your health. ? Most adults should exercise for at least 150 minutes each week. The exercise should increase your heart rate and make you sweat (moderate-intensity exercise). ? Most adults should also do  strengthening exercises at least twice a week. This is in addition to the moderate-intensity exercise.  Maintain a healthy weight  Body mass index (BMI) is a measurement that can be used to identify possible weight problems. It estimates body fat based on height and weight. Your health care provider can help determine your BMI and help you achieve or maintain a healthy weight.  For females 6 years of age and older: ? A BMI below 18.5 is considered underweight. ? A BMI of 18.5 to 24.9 is normal. ? A BMI of 25 to 29.9 is considered overweight. ? A BMI of 30 and above is considered obese.  Watch levels of cholesterol and blood lipids  You should start having your blood tested for lipids and cholesterol at 49 years of age, then have this test every 5 years.  You may need to have your cholesterol levels checked more often if: ? Your lipid or cholesterol levels are high. ? You are older than 49 years of age. ? You are at high risk for heart disease.  Cancer screening Lung Cancer  Lung cancer screening is recommended for adults 24-72 years old who are at high risk for lung cancer because of a history of smoking.  A yearly low-dose CT scan of the lungs is recommended for people who: ? Currently smoke. ? Have quit within the past 15 years. ? Have at least a 30-pack-year history of smoking. A pack year is smoking an average of one pack of cigarettes a day for 1 year.  Yearly screening should continue until it has been 15 years since you quit.  Yearly screening should stop if you develop a health problem that would prevent you from having lung cancer treatment.  Breast Cancer  Practice breast self-awareness. This means understanding how your breasts normally appear and feel.  It also means doing regular breast self-exams. Let your health care provider know about any changes, no matter how small.  If you are in your 20s or 30s, you should have a clinical breast exam (CBE) by a health  care provider every 1-3 years as part of a regular health exam.  If you are 60 or older, have a CBE every year. Also consider having a breast X-ray (mammogram) every year.  If you have a family history of breast cancer, talk to your health care provider about genetic screening.  If you are at high risk for breast cancer, talk to your health care provider about having an MRI and a mammogram every year.  Breast cancer gene (BRCA) assessment is recommended for women who have family members with BRCA-related cancers. BRCA-related cancers include: ? Breast. ? Ovarian. ? Tubal. ? Peritoneal cancers.  Results of the assessment will determine the need for genetic counseling and BRCA1 and BRCA2 testing.  Cervical Cancer Your health care provider may recommend that you be screened regularly for cancer of the pelvic organs (ovaries, uterus, and vagina). This screening involves a pelvic examination, including checking for microscopic changes to the surface of your cervix (Pap test). You may be encouraged to have this screening done every 3 years, beginning at age 19.  For  women ages 57-65, health care providers may recommend pelvic exams and Pap testing every 3 years, or they may recommend the Pap and pelvic exam, combined with testing for human papilloma virus (HPV), every 5 years. Some types of HPV increase your risk of cervical cancer. Testing for HPV may also be done on women of any age with unclear Pap test results.  Other health care providers may not recommend any screening for nonpregnant women who are considered low risk for pelvic cancer and who do not have symptoms. Ask your health care provider if a screening pelvic exam is right for you.  If you have had past treatment for cervical cancer or a condition that could lead to cancer, you need Pap tests and screening for cancer for at least 20 years after your treatment. If Pap tests have been discontinued, your risk factors (such as having a new  sexual partner) need to be reassessed to determine if screening should resume. Some women have medical problems that increase the chance of getting cervical cancer. In these cases, your health care provider may recommend more frequent screening and Pap tests.  Colorectal Cancer  This type of cancer can be detected and often prevented.  Routine colorectal cancer screening usually begins at 49 years of age and continues through 49 years of age.  Your health care provider may recommend screening at an earlier age if you have risk factors for colon cancer.  Your health care provider may also recommend using home test kits to check for hidden blood in the stool.  A small camera at the end of a tube can be used to examine your colon directly (sigmoidoscopy or colonoscopy). This is done to check for the earliest forms of colorectal cancer.  Routine screening usually begins at age 50.  Direct examination of the colon should be repeated every 5-10 years through 49 years of age. However, you may need to be screened more often if early forms of precancerous polyps or small growths are found.  Skin Cancer  Check your skin from head to toe regularly.  Tell your health care provider about any new moles or changes in moles, especially if there is a change in a mole's shape or color.  Also tell your health care provider if you have a mole that is larger than the size of a pencil eraser.  Always use sunscreen. Apply sunscreen liberally and repeatedly throughout the day.  Protect yourself by wearing long sleeves, pants, a wide-brimmed hat, and sunglasses whenever you are outside.  Heart disease, diabetes, and high blood pressure  High blood pressure causes heart disease and increases the risk of stroke. High blood pressure is more likely to develop in: ? People who have blood pressure in the high end of the normal range (130-139/85-89 mm Hg). ? People who are overweight or obese. ? People who are  African American.  If you are 46-44 years of age, have your blood pressure checked every 3-5 years. If you are 77 years of age or older, have your blood pressure checked every year. You should have your blood pressure measured twice-once when you are at a hospital or clinic, and once when you are not at a hospital or clinic. Record the average of the two measurements. To check your blood pressure when you are not at a hospital or clinic, you can use: ? An automated blood pressure machine at a pharmacy. ? A home blood pressure monitor.  If you are between 55 years and  21 years old, ask your health care provider if you should take aspirin to prevent strokes.  Have regular diabetes screenings. This involves taking a blood sample to check your fasting blood sugar level. ? If you are at a normal weight and have a low risk for diabetes, have this test once every three years after 49 years of age. ? If you are overweight and have a high risk for diabetes, consider being tested at a younger age or more often. Preventing infection Hepatitis B  If you have a higher risk for hepatitis B, you should be screened for this virus. You are considered at high risk for hepatitis B if: ? You were born in a country where hepatitis B is common. Ask your health care provider which countries are considered high risk. ? Your parents were born in a high-risk country, and you have not been immunized against hepatitis B (hepatitis B vaccine). ? You have HIV or AIDS. ? You use needles to inject street drugs. ? You live with someone who has hepatitis B. ? You have had sex with someone who has hepatitis B. ? You get hemodialysis treatment. ? You take certain medicines for conditions, including cancer, organ transplantation, and autoimmune conditions.  Hepatitis C  Blood testing is recommended for: ? Everyone born from [birth year] through 1963. ? Anyone with known risk factors for hepatitis C.  Sexually transmitted  infections (STIs)  You should be screened for sexually transmitted infections (STIs) including gonorrhea and chlamydia if: ? You are sexually active and are younger than 49 years of age. ? You are older than 49 years of age and your health care provider tells you that you are at risk for this type of infection. ? Your sexual activity has changed since you were last screened and you are at an increased risk for chlamydia or gonorrhea. Ask your health care provider if you are at risk.  If you do not have HIV, but are at risk, it may be recommended that you take a prescription medicine daily to prevent HIV infection. This is called pre-exposure prophylaxis (PrEP). You are considered at risk if: ? You are sexually active and do not regularly use condoms or know the HIV status of your partner(s). ? You take drugs by injection. ? You are sexually active with a partner who has HIV.  Talk with your health care provider about whether you are at high risk of being infected with HIV. If you choose to begin PrEP, you should first be tested for HIV. You should then be tested every 3 months for as long as you are taking PrEP. Pregnancy  If you are premenopausal and you may become pregnant, ask your health care provider about preconception counseling.  If you may become pregnant, take 400 to 800 micrograms (mcg) of folic acid every day.  If you want to prevent pregnancy, talk to your health care provider about birth control (contraception). Osteoporosis and menopause  Osteoporosis is a disease in which the bones lose minerals and strength with aging. This can result in serious bone fractures. Your risk for osteoporosis can be identified using a bone density scan.  If you are 11 years of age or older, or if you are at risk for osteoporosis and fractures, ask your health care provider if you should be screened.  Ask your health care provider whether you should take a calcium or vitamin D supplement to lower  your risk for osteoporosis.  Menopause may have certain physical symptoms  and risks.  Hormone replacement therapy may reduce some of these symptoms and risks. Talk to your health care provider about whether hormone replacement therapy is right for you. Follow these instructions at home:  Schedule regular health, dental, and eye exams.  Stay current with your immunizations.  Do not use any tobacco products including cigarettes, chewing tobacco, or electronic cigarettes.  If you are pregnant, do not drink alcohol.  If you are breastfeeding, limit how much and how often you drink alcohol.  Limit alcohol intake to no more than 1 drink per day for nonpregnant women. One drink equals 12 ounces of beer, 5 ounces of wine, or 1 ounces of hard liquor.  Do not use street drugs.  Do not share needles.  Ask your health care provider for help if you need support or information about quitting drugs.  Tell your health care provider if you often feel depressed.  Tell your health care provider if you have ever been abused or do not feel safe at home. This information is not intended to replace advice given to you by your health care provider. Make sure you discuss any questions you have with your health care provider. Document Released: 09/07/2010 Document Revised: 07/31/2015 Document Reviewed: 11/26/2014 Elsevier Interactive Patient Education  [employer].

## 2016-09-06 NOTE — Progress Notes (Signed)
Tanya Richard 1965-09-14 [id number]    History:    Presents for annual exam.  Monthly 3-4 day cycles on Loestrin, has recently skipped 1 cycle on OCs with occasional hot flushes.  Not sexually active in greater than 2 years, denies need for STD screen. 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 Normal mammogram history. Primary care manages labs and meds for hypertension. Has had a problem with  right foot torn ligament, wears a boot.   Past medical history, past surgical history, family history and social history were all reviewed and documented in the EPIC chart. Works from home for [employer]. History of kidney stones. Mother diabetes, hypertension and hypercholesterolemia.  ROS:  A ROS was performed and pertinent positives and negatives are included.  Exam:  Vitals:   09/06/16 1605  BP: 120/72  Weight: 214 lb (97.1 kg)  Height: 5\' 4"  (1.626 m)   Body mass index is 36.73 kg/m.   General appearance:  Normal Thyroid:  Symmetrical, normal in size, without palpable masses or nodularity. Respiratory  Auscultation:  Clear without wheezing or rhonchi Cardiovascular  Auscultation:  Regular rate, without rubs, murmurs or gallops  Edema/varicosities:  Not grossly evident Abdominal  Soft,nontender, without masses, guarding or rebound.  Liver/spleen:  No organomegaly noted  Hernia:  None appreciated  Skin  Inspection:  Grossly normal   Breasts: Examined lying and sitting.     Right: Without masses, retractions, discharge or axillary adenopathy.     Left: Without masses, retractions, discharge or axillary adenopathy. Gentitourinary   Inguinal/mons:  Normal without inguinal adenopathy  External genitalia:  Normal  BUS/Urethra/Skene's glands:  Normal  Vagina:  Normal  Cervix:  Normal  Uterus:  normal in size, shape and contour.  Midline and mobile  Adnexa/parametria:     Rt: Without masses or tenderness.   Lt: Without masses or tenderness.  Anus and perineum: Normal  Digital rectal  exam: Normal sphincter tone without palpated masses or tenderness  Assessment/Plan:  51 y.o. SBF G0 for annual exam with no complaints.  Mostly monthly cycle on OCs Hypertension-primary care manages labs and meds Right foot torn ligament Obesity  Plan: Options reviewed. Loestrin 1/20 prescription, proper use given and reviewed will continue through this summer and stop in September, instructed to call if cycles become heavy, irregular, or increased hot flushes. Reviewed importance of condoms if sexually active. Risks of blood clots, strokes reviewed with OCs. SBE's, continue annual screening mammogram, calcium rich diet, vitamin D 1000 daily encouraged. Encouraged increased exercise and decrease calories/carbs for weight loss. Pap normal with negative HR HPV 2017, new screening guidelines reviewed.    Harrington Challengerancy J Young Apollo HospitalWHNP, 4:44 PM 09/06/2016

## 2016-10-18 ENCOUNTER — Other Ambulatory Visit: Payer: Self-pay | Admitting: Internal Medicine

## 2016-11-25 ENCOUNTER — Encounter: Payer: Self-pay | Admitting: Internal Medicine

## 2016-12-20 ENCOUNTER — Ambulatory Visit (INDEPENDENT_AMBULATORY_CARE_PROVIDER_SITE_OTHER): Payer: BLUE CROSS/BLUE SHIELD

## 2016-12-20 DIAGNOSIS — Z23 Encounter for immunization: Secondary | ICD-10-CM | POA: Diagnosis not present

## 2017-02-01 ENCOUNTER — Other Ambulatory Visit: Payer: Self-pay | Admitting: Women's Health

## 2017-02-01 DIAGNOSIS — Z1231 Encounter for screening mammogram for malignant neoplasm of breast: Secondary | ICD-10-CM

## 2017-03-04 ENCOUNTER — Ambulatory Visit
Admission: RE | Admit: 2017-03-04 | Discharge: 2017-03-04 | Disposition: A | Discharge status: Home / Self Care | Payer: BLUE CROSS/BLUE SHIELD | Source: Ambulatory Visit | Attending: Women's Health | Admitting: Women's Health

## 2017-03-04 DIAGNOSIS — Z1231 Encounter for screening mammogram for malignant neoplasm of breast: Secondary | ICD-10-CM

## 2017-03-07 ENCOUNTER — Encounter: Payer: Self-pay | Admitting: Women's Health

## 2017-05-03 ENCOUNTER — Encounter: Payer: Self-pay | Admitting: Internal Medicine

## 2017-05-03 ENCOUNTER — Ambulatory Visit (INDEPENDENT_AMBULATORY_CARE_PROVIDER_SITE_OTHER): Payer: 59 | Admitting: Internal Medicine

## 2017-05-03 VITALS — BP 118/82 | HR 93 | Temp 98.5°F | Ht 63.5 in | Wt 211.8 lb

## 2017-05-03 DIAGNOSIS — Z0000 Encounter for general adult medical examination without abnormal findings: Secondary | ICD-10-CM | POA: Diagnosis not present

## 2017-05-03 NOTE — Progress Notes (Signed)
Subjective:    Patient ID: Tanya Richard, female    DOB: 1965-12-23, 52 y.o.   MRN: [id number]  HPI  52 year old patient who is seen today for a preventive health examination She is seen in the by gynecology and did have a mammogram late last year. She has essential hypertension and does monitor home blood pressure readings these have been very well controlled She has exogenous obesity. No new concerns or complaints  Social history single lives alone works out of her home for an [employer], [employer] agency  Past Medical History:  Diagnosis Date  . ELEVATED BP READING WITHOUT DX HYPERTENSION 10/14/2009  . NEPHROLITHIASIS, HX OF 12/21/2007  . Renal colic 12/21/2007     Social History   Socioeconomic History  . Marital status: Single    Spouse name: Not on file  . Number of children: Not on file  . Years of education: Not on file  . Highest education level: Not on file  Social Needs  . Financial resource strain: Not on file  . Food insecurity - worry: Not on file  . Food insecurity - inability: Not on file  . Transportation needs - medical: Not on file  . Transportation needs - non-medical: Not on file  Occupational History  . Not on file  Tobacco Use  . Smoking status: Never Smoker  . Smokeless tobacco: Never Used  Substance and Sexual Activity  . Alcohol use: Yes    Alcohol/week: 0.0 oz    Comment: rare  . Drug use: No  . Sexual activity: Yes    Birth control/protection: Pill    Comment: INTERCOURSE AGE 4, SEXUAL PARTNERS LESS THAN 5  Other Topics Concern  . Not on file  Social History Narrative  . Not on file    Past Surgical History:  Procedure Laterality Date  . COLPOSCOPY    . GYNECOLOGIC CRYOSURGERY    . MOUTH SURGERY  1984    Family History  Problem Relation Age of Onset  . Diabetes Mother   . Other Father        DVT  . Hypertension Father   . Pulmonary embolism Father        x2  . Diabetes Maternal Aunt   . Hypertension Maternal Grandmother   .  Cancer Maternal Grandmother        MGGM-Uterine cancer  . Arthritis Neg Hx        family hx    Allergies  Allergen Reactions  . Lisinopril Swelling    Angioedema of lip see ed visit 101 6    Current Outpatient Medications on File Prior to Visit  Medication Sig Dispense Refill  . amLODipine (NORVASC) 5 MG tablet TAKE 1 TABLET (5 MG TOTAL) BY MOUTH DAILY. 90 tablet 1  . hydrochlorothiazide (HYDRODIURIL) 25 MG tablet TAKE 1 TABLET BY MOUTH DAILY 90 tablet 1  . loratadine (CLARITIN) 10 MG tablet Take 10 mg by mouth daily as needed.     . Multiple Vitamin (MULTIVITAMIN) capsule Take 1 capsule by mouth daily.      . naproxen sodium (ANAPROX) 550 MG tablet Take 1 tablet (550 mg total) by mouth 2 (two) times daily with a meal. 60 tablet 0  . norethindrone-ethinyl estradiol (JUNEL FE 1/20) 1-20 MG-MCG tablet     . norethindrone-ethinyl estradiol (JUNEL FE,GILDESS FE,LOESTRIN FE) 1-20 MG-MCG tablet Take 1 tablet by mouth daily. 3 Package 4   No current facility-administered medications on file prior to visit.     BP 118/82 (BP  Location: Right Arm, Patient Position: Sitting, Cuff Size: Large)   Pulse 93   Temp 98.5 F (36.9 C) (Oral)   Ht 5' 3.5" (1.613 m)   Wt 211 lb 12.8 oz (96.1 kg)   LMP 03/22/2017   SpO2 99%   BMI 36.93 kg/m     Review of Systems  Constitutional: Negative.   HENT: Negative for congestion, dental problem, hearing loss, rhinorrhea, sinus pressure, sore throat and tinnitus.   Eyes: Negative for pain, discharge and visual disturbance.  Respiratory: Negative for cough and shortness of breath.   Cardiovascular: Negative for chest pain, palpitations and leg swelling.  Gastrointestinal: Negative for abdominal distention, abdominal pain, blood in stool, constipation, diarrhea, nausea and vomiting.  Genitourinary: Negative for difficulty urinating, dysuria, flank pain, frequency, hematuria, pelvic pain, urgency, vaginal bleeding, vaginal discharge and vaginal pain.    Musculoskeletal: Negative for arthralgias, gait problem and joint swelling.  Skin: Negative for rash.  Neurological: Negative for dizziness, syncope, speech difficulty, weakness, numbness and headaches.  Hematological: Negative for adenopathy.  Psychiatric/Behavioral: Negative for agitation, behavioral problems and dysphoric mood. The patient is not nervous/anxious.        Objective:   Physical Exam  Constitutional: She is oriented to person, place, and time. She appears well-developed and well-nourished.  Blood pressure low normal Weight 211  HENT:  Head: Normocephalic.  Right Ear: External ear normal.  Left Ear: External ear normal.  Mouth/Throat: Oropharynx is clear and moist.  Eyes: Conjunctivae and EOM are normal. Pupils are equal, round, and reactive to light.  Neck: Normal range of motion. Neck supple. No thyromegaly present.  Cardiovascular: Normal rate, regular rhythm, normal heart sounds and intact distal pulses.  Pulmonary/Chest: Effort normal and breath sounds normal.  Abdominal: Soft. Bowel sounds are normal. She exhibits no mass. There is no tenderness.  Musculoskeletal: Normal range of motion.  Lymphadenopathy:    She has no cervical adenopathy.  Neurological: She is alert and oriented to person, place, and time.  Skin: Skin is warm and dry. No rash noted.  Psychiatric: She has a normal mood and affect. Her behavior is normal.          Assessment & Plan:   Preventive healthcare Hypertension well-controlled Exogenous obesity.  Weight loss encouraged  Follow-up OB/GYN annually Colonoscopy one year Review screening lab DASH Diet discussed and dispensed  Nike Southwell Homero Fellers

## 2017-05-03 NOTE — Patient Instructions (Addendum)
Limit your sodium (Salt) intake  Please check your blood pressure on a regular basis.  If it is consistently greater than 150/90, please make an office appointment.    It is important that you exercise regularly, at least 20 minutes 3 to 4 times per week.  If you develop chest pain or shortness of breath seek  medical attention.  You need to lose weight.  Consider a lower calorie diet and regular exercise.   DASH Eating Plan DASH stands for "Dietary Approaches to Stop Hypertension." The DASH eating plan is a healthy eating plan that has been shown to reduce high blood pressure (hypertension). It may also reduce your risk for type 2 diabetes, heart disease, and stroke. The DASH eating plan may also help with weight loss. What are tips for following this plan? General guidelines  Avoid eating more than 2,300 mg (milligrams) of salt (sodium) a day. If you have hypertension, you may need to reduce your sodium intake to 1,500 mg a day.  Limit alcohol intake to no more than 1 drink a day for nonpregnant women and 2 drinks a day for men. One drink equals 12 oz of beer, 5 oz of wine, or 1 oz of hard liquor.  Work with your health care provider to maintain a healthy body weight or to lose weight. Ask what an ideal weight is for you.  Get at least 30 minutes of exercise that causes your heart to beat faster (aerobic exercise) most days of the week. Activities may include walking, swimming, or biking.  Work with your health care provider or diet and nutrition specialist (dietitian) to adjust your eating plan to your individual calorie needs. Reading food labels  Check food labels for the amount of sodium per serving. Choose foods with less than 5 percent of the Daily Value of sodium. Generally, foods with less than 300 mg of sodium per serving fit into this eating plan.  To find whole grains, look for the word "whole" as the first word in the ingredient list. Shopping  Buy products labeled as  "low-sodium" or "no salt added."  Buy fresh foods. Avoid canned foods and premade or frozen meals. Cooking  Avoid adding salt when cooking. Use salt-free seasonings or herbs instead of table salt or sea salt. Check with your health care provider or pharmacist before using salt substitutes.  Do not fry foods. Cook foods using healthy methods such as baking, boiling, grilling, and broiling instead.  Cook with heart-healthy oils, such as olive, canola, soybean, or sunflower oil. Meal planning   Eat a balanced diet that includes: ? 5 or more servings of fruits and vegetables each day. At each meal, try to fill half of your plate with fruits and vegetables. ? Up to 6-8 servings of whole grains each day. ? Less than 6 oz of lean meat, poultry, or fish each day. A 3-oz serving of meat is about the same size as a deck of cards. One egg equals 1 oz. ? 2 servings of low-fat dairy each day. ? A serving of nuts, seeds, or beans 5 times each week. ? Heart-healthy fats. Healthy fats called Omega-3 fatty acids are found in foods such as flaxseeds and coldwater fish, like sardines, salmon, and mackerel.  Limit how much you eat of the following: ? Canned or prepackaged foods. ? Food that is high in trans fat, such as fried foods. ? Food that is high in saturated fat, such as fatty meat. ? Sweets, desserts, sugary drinks,   and other foods with added sugar. ? Full-fat dairy products.  Do not salt foods before eating.  Try to eat at least 2 vegetarian meals each week.  Eat more home-cooked food and less restaurant, buffet, and fast food.  When eating at a restaurant, ask that your food be prepared with less salt or no salt, if possible. What foods are recommended? The items listed may not be a complete list. Talk with your dietitian about what dietary choices are best for you. Grains Whole-grain or whole-wheat bread. Whole-grain or whole-wheat pasta. Brown rice. Oatmeal. Quinoa. Bulgur. Whole-grain  and low-sodium cereals. Pita bread. Low-fat, low-sodium crackers. Whole-wheat flour tortillas. Vegetables Fresh or frozen vegetables (raw, steamed, roasted, or grilled). Low-sodium or reduced-sodium tomato and vegetable juice. Low-sodium or reduced-sodium tomato sauce and tomato paste. Low-sodium or reduced-sodium canned vegetables. Fruits All fresh, dried, or frozen fruit. Canned fruit in natural juice (without added sugar). Meat and other protein foods Skinless chicken or turkey. Ground chicken or turkey. Pork with fat trimmed off. Fish and seafood. Egg whites. Dried beans, peas, or lentils. Unsalted nuts, nut butters, and seeds. Unsalted canned beans. Lean cuts of beef with fat trimmed off. Low-sodium, lean deli meat. Dairy Low-fat (1%) or fat-free (skim) milk. Fat-free, low-fat, or reduced-fat cheeses. Nonfat, low-sodium ricotta or cottage cheese. Low-fat or nonfat yogurt. Low-fat, low-sodium cheese. Fats and oils Soft margarine without trans fats. Vegetable oil. Low-fat, reduced-fat, or light mayonnaise and salad dressings (reduced-sodium). Canola, safflower, olive, soybean, and sunflower oils. Avocado. Seasoning and other foods Herbs. Spices. Seasoning mixes without salt. Unsalted popcorn and pretzels. Fat-free sweets. What foods are not recommended? The items listed may not be a complete list. Talk with your dietitian about what dietary choices are best for you. Grains Baked goods made with fat, such as croissants, muffins, or some breads. Dry pasta or rice meal packs. Vegetables Creamed or fried vegetables. Vegetables in a cheese sauce. Regular canned vegetables (not low-sodium or reduced-sodium). Regular canned tomato sauce and paste (not low-sodium or reduced-sodium). Regular tomato and vegetable juice (not low-sodium or reduced-sodium). Pickles. Olives. Fruits Canned fruit in a light or heavy syrup. Fried fruit. Fruit in cream or butter sauce. Meat and other protein foods Fatty cuts  of meat. Ribs. Fried meat. Bacon. Sausage. Bologna and other processed lunch meats. Salami. Fatback. Hotdogs. Bratwurst. Salted nuts and seeds. Canned beans with added salt. Canned or smoked fish. Whole eggs or egg yolks. Chicken or turkey with skin. Dairy Whole or 2% milk, cream, and half-and-half. Whole or full-fat cream cheese. Whole-fat or sweetened yogurt. Full-fat cheese. Nondairy creamers. Whipped toppings. Processed cheese and cheese spreads. Fats and oils Butter. Stick margarine. Lard. Shortening. Ghee. Bacon fat. Tropical oils, such as coconut, palm kernel, or palm oil. Seasoning and other foods Salted popcorn and pretzels. Onion salt, garlic salt, seasoned salt, table salt, and sea salt. Worcestershire sauce. Tartar sauce. Barbecue sauce. Teriyaki sauce. Soy sauce, including reduced-sodium. Steak sauce. Canned and packaged gravies. Fish sauce. Oyster sauce. Cocktail sauce. Horseradish that you find on the shelf. Ketchup. Mustard. Meat flavorings and tenderizers. Bouillon cubes. Hot sauce and Tabasco sauce. Premade or packaged marinades. Premade or packaged taco seasonings. Relishes. Regular salad dressings. Where to find more information:  National Heart, Lung, and Blood Institute: www.nhlbi.nih.gov  American Heart Association: www.heart.org Summary  The DASH eating plan is a healthy eating plan that has been shown to reduce high blood pressure (hypertension). It may also reduce your risk for type 2 diabetes, heart disease, and stroke.    With the DASH eating plan, you should limit salt (sodium) intake to 2,300 mg a day. If you have hypertension, you may need to reduce your sodium intake to 1,500 mg a day.  When on the DASH eating plan, aim to eat more fresh fruits and vegetables, whole grains, lean proteins, low-fat dairy, and heart-healthy fats.  Work with your health care provider or diet and nutrition specialist (dietitian) to adjust your eating plan to your individual calorie  needs. This information is not intended to replace advice given to you by your health care provider. Make sure you discuss any questions you have with your health care provider. Document Released: 02/11/2011 Document Revised: 02/16/2016 Document Reviewed: 02/16/2016 Elsevier Interactive Patient Education  2018 Elsevier Inc.  

## 2017-05-04 ENCOUNTER — Encounter: Payer: Self-pay | Admitting: Internal Medicine

## 2017-05-04 LAB — CBC WITH DIFFERENTIAL/PLATELET
BASOS PCT: 1.2 % (ref 0.0–3.0)
Basophils Absolute: 0.1 10*3/uL (ref 0.0–0.1)
EOS PCT: 1.1 % (ref 0.0–5.0)
Eosinophils Absolute: 0.1 10*3/uL (ref 0.0–0.7)
HCT: 36.5 % (ref 36.0–46.0)
Hemoglobin: 12.3 g/dL (ref 12.0–15.0)
LYMPHS ABS: 4.1 10*3/uL — AB (ref 0.7–4.0)
Lymphocytes Relative: 37.6 % (ref 12.0–46.0)
MCHC: 33.6 g/dL (ref 30.0–36.0)
MCV: 84.7 fl (ref 78.0–100.0)
MONO ABS: 0 10*3/uL — AB (ref 0.1–1.0)
MONOS PCT: 0.2 % — AB (ref 3.0–12.0)
NEUTROS ABS: 6.6 10*3/uL (ref 1.4–7.7)
NEUTROS PCT: 59.9 % (ref 43.0–77.0)
PLATELETS: 370 10*3/uL (ref 150.0–400.0)
RBC: 4.31 Mil/uL (ref 3.87–5.11)
RDW: 14.4 % (ref 11.5–15.5)
WBC: 11 10*3/uL — ABNORMAL HIGH (ref 4.0–10.5)

## 2017-05-04 LAB — COMPREHENSIVE METABOLIC PANEL
ALT: 13 U/L (ref 0–35)
AST: 14 U/L (ref 0–37)
Albumin: 3.7 g/dL (ref 3.5–5.2)
Alkaline Phosphatase: 118 U/L — ABNORMAL HIGH (ref 39–117)
BUN: 13 mg/dL (ref 6–23)
CO2: 31 meq/L (ref 19–32)
Calcium: 9.8 mg/dL (ref 8.4–10.5)
Chloride: 99 mEq/L (ref 96–112)
Creatinine, Ser: 0.5 mg/dL (ref 0.40–1.20)
GFR: 168.42 mL/min (ref >60.00)
GLUCOSE: 76 mg/dL (ref 70–99)
POTASSIUM: 3 meq/L — AB (ref 3.5–5.1)
Sodium: 141 mEq/L (ref 135–145)
TOTAL PROTEIN: 7.5 g/dL (ref 6.0–8.3)
Total Bilirubin: 0.3 mg/dL (ref 0.2–1.2)

## 2017-05-04 LAB — LIPID PANEL
Cholesterol: 179 mg/dL (ref 0–200)
HDL: 70.5 mg/dL (ref >39.00)
LDL Cholesterol: 95 mg/dL (ref 0–99)
NONHDL: 108.82
Total CHOL/HDL Ratio: 3
Triglycerides: 68 mg/dL (ref 0.0–149.0)
VLDL: 13.6 mg/dL (ref 0.0–40.0)

## 2017-05-04 LAB — TSH: TSH: 1.1 u[IU]/mL (ref 0.35–4.50)

## 2017-05-04 MED ORDER — POTASSIUM CHLORIDE CRYS ER 20 MEQ PO TBCR: 20.0000 meq | EXTENDED_RELEASE_TABLET | Freq: Every day | ORAL | 0 refills | Status: DC

## 2017-05-04 NOTE — Addendum Note (Signed)
Addended by: Waymon AmatoJOHNSON, Shelise Maron R on: 05/04/2017 01:58 PM   Modules accepted: Orders

## 2017-05-09 ENCOUNTER — Encounter: Payer: Self-pay | Admitting: Internal Medicine

## 2017-05-20 ENCOUNTER — Other Ambulatory Visit: Payer: Self-pay

## 2017-05-20 MED ORDER — AMLODIPINE BESYLATE 5 MG PO TABS: ORAL_TABLET | ORAL | 2 refills | Status: DC

## 2017-08-03 ENCOUNTER — Encounter: Payer: Self-pay | Admitting: Internal Medicine

## 2017-09-26 ENCOUNTER — Encounter: Payer: Self-pay | Admitting: Women's Health

## 2017-10-03 ENCOUNTER — Ambulatory Visit (INDEPENDENT_AMBULATORY_CARE_PROVIDER_SITE_OTHER): Payer: 59 | Admitting: Women's Health

## 2017-10-03 ENCOUNTER — Encounter: Payer: Self-pay | Admitting: Women's Health

## 2017-10-03 VITALS — BP 120/90 | Ht 64.0 in | Wt 204.4 lb

## 2017-10-03 DIAGNOSIS — Z01419 Encounter for gynecological examination (general) (routine) without abnormal findings: Secondary | ICD-10-CM

## 2017-10-03 DIAGNOSIS — Z3041 Encounter for surveillance of contraceptive pills: Secondary | ICD-10-CM | POA: Diagnosis not present

## 2017-10-03 MED ORDER — NAPROXEN SODIUM 550 MG PO TABS: 550.0000 mg | ORAL_TABLET | Freq: Two times a day (BID) | ORAL | 0 refills | Status: DC

## 2017-10-03 MED ORDER — NORETHIN ACE-ETH ESTRAD-FE 1-20 MG-MCG PO TABS: 1.0000 | ORAL_TABLET | Freq: Every day | ORAL | 4 refills | Status: DC

## 2017-10-03 NOTE — Progress Notes (Signed)
Tanya JoyLisa Stiverson 05/30/1965 [id number]    History:    Presents for annual exam.  Light monthly cycle on Loestrin until February no cycle February through May so stopped Loestrin, June heavy cycle with dysmenorrhea, July cycle also heavy.  Normal mammogram history.  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  Not sexually active in greater than 2 years.  Hypertension managed by primary care.  Has lost 10 pounds in the past year with diet .  Past medical history, past surgical history, family history and social history were all reviewed and documented in the EPIC chart.  Works from home for [employer].  Mother diabetes, hypertension, hypercholesteremia, myasthenia gravis, osteoporosis, lymphedema and CLL.  ROS:  A ROS was performed and pertinent positives and negatives are included.  Exam:  Vitals:   10/03/17 1451  BP: 120/90  Weight: 204 lb 6.4 oz (92.7 kg)  Height: 5\' 4"  (1.626 m)   Body mass index is 35.09 kg/m.   General appearance:  Normal Thyroid:  Symmetrical, normal in size, without palpable masses or nodularity. Respiratory  Auscultation:  Clear without wheezing or rhonchi Cardiovascular  Auscultation:  Regular rate, without rubs, murmurs or gallops  Edema/varicosities:  Not grossly evident Abdominal  Soft,nontender, without masses, guarding or rebound.  Liver/spleen:  No organomegaly noted  Hernia:  None appreciated  Skin  Inspection:  Grossly normal   Breasts: Examined lying and sitting.     Right: Without masses, retractions, discharge or axillary adenopathy.     Left: Without masses, retractions, discharge or axillary adenopathy. Gentitourinary   Inguinal/mons:  Normal without inguinal adenopathy  External genitalia:  Normal  BUS/Urethra/Skene's glands:  Normal  Vagina:  Normal  Cervix:  Normal  Uterus:  normal in size, shape and contour.  Midline and mobile  Adnexa/parametria:     Rt: Without masses or tenderness.   Lt: Without masses or tenderness.  Anus and  perineum: Normal  Digital rectal exam: Normal sphincter tone without palpated masses or tenderness  Assessment/Plan:  52 y.o. SBF G0 for annual exam with no complaints.  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  We will continue another year come to appointment next year on placebo week [facility]  Menopause reviewed, states cycles were miserable with cramping, heavy flow and would prefer to stay on OCs at this time.  Condoms encouraged if sexually active.  Denies need for STD screen.  SBE's, annual screening mammogram, calcium rich foods, vitamin D 1000 daily encouraged.  Reviewed importance of increasing regular exercise, yoga encouraged.  Congratulated on weight loss.  Continue healthy diet.  Pap normal with negative HR HPV 2017,  requested Pap, reviewed new screening guidelines,  Pap done.   Harrington Challengerancy J Young [facility]NP, 3:29 PM 10/03/2017

## 2017-10-03 NOTE — Patient Instructions (Signed)

## 2017-10-03 NOTE — Addendum Note (Signed)
Addended by: Becky SaxPARKER, Dmonte Maher M on: 10/03/2017 03:51 PM   Modules accepted: Orders

## 2017-10-04 LAB — PAP IG W/ RFLX HPV ASCU

## 2017-11-09 ENCOUNTER — Other Ambulatory Visit: Payer: Self-pay | Admitting: Internal Medicine

## 2017-11-15 ENCOUNTER — Other Ambulatory Visit: Payer: Self-pay | Admitting: Women's Health

## 2017-11-15 MED ORDER — METRONIDAZOLE 500 MG PO TABS: 500.0000 mg | ORAL_TABLET | Freq: Two times a day (BID) | ORAL | 0 refills | Status: DC

## 2017-12-20 ENCOUNTER — Ambulatory Visit (INDEPENDENT_AMBULATORY_CARE_PROVIDER_SITE_OTHER): Payer: 59

## 2017-12-20 DIAGNOSIS — Z23 Encounter for immunization: Secondary | ICD-10-CM

## 2018-02-22 ENCOUNTER — Other Ambulatory Visit: Payer: Self-pay | Admitting: Women's Health

## 2018-02-22 DIAGNOSIS — Z1231 Encounter for screening mammogram for malignant neoplasm of breast: Secondary | ICD-10-CM

## 2018-03-21 ENCOUNTER — Ambulatory Visit
Admission: RE | Admit: 2018-03-21 | Discharge: 2018-03-21 | Disposition: A | Discharge status: Home / Self Care | Payer: 59 | Source: Ambulatory Visit | Attending: Women's Health | Admitting: Women's Health

## 2018-03-21 DIAGNOSIS — Z1231 Encounter for screening mammogram for malignant neoplasm of breast: Secondary | ICD-10-CM

## 2018-05-16 NOTE — Progress Notes (Deleted)
Ariyonna Arriola DOB: 30-Mar-1965 Encounter date: 05/17/2018  This isa 53 y.o. female who presents to establish care. No chief complaint on file.   History of present illness: Follows with ophth:Dr. Beverlyn Roux [facility] needs  HTN:  Has been over a year since bloodwork.   ?paresthesias hands?  ?HIV?colonoscopy ***   Past Medical History:  Diagnosis Date  . ELEVATED BP READING WITHOUT DX HYPERTENSION 10/14/2009  . NEPHROLITHIASIS, HX OF 12/21/2007  . Renal colic 12/21/2007   Past Surgical History:  Procedure Laterality Date  . COLPOSCOPY    . GYNECOLOGIC CRYOSURGERY    . MOUTH SURGERY  1984   Allergies  Allergen Reactions  . Lisinopril Swelling    Angioedema of lip see ed visit 101 6   No outpatient medications have been marked as taking for the 05/17/18 encounter (Appointment) with Wynn Banker, MD.   Social History   Tobacco Use  . Smoking status: Never Smoker  . Smokeless tobacco: Never Used  Substance Use Topics  . Alcohol use: Yes    Alcohol/week: 0.0 standard drinks    Comment: rare   Family History  Problem Relation Age of Onset  . Diabetes Mother   . Other Father        DVT  . Hypertension Father   . Pulmonary embolism Father        x2  . Diabetes Maternal Aunt   . Hypertension Maternal Grandmother   . Cancer Maternal Grandmother        MGGM-Uterine cancer  . Arthritis Neg Hx        family hx     Review of Systems  Objective:  There were no vitals taken for this visit.      BP Readings from Last 3 Encounters:  10/03/17 120/90  05/03/17 118/82  09/06/16 120/72   Wt Readings from Last 3 Encounters:  10/03/17 204 lb 6.4 oz (92.7 kg)  05/03/17 211 lb 12.8 oz (96.1 kg)  09/06/16 214 lb (97.1 kg)    Physical Exam  Assessment/Plan:  There are no diagnoses linked to this encounter.  No follow-ups on file.  Theodis Shove, MD

## 2018-05-17 ENCOUNTER — Encounter: Payer: 59 | Admitting: Family Medicine

## 2018-05-18 ENCOUNTER — Other Ambulatory Visit: Payer: Self-pay | Admitting: Internal Medicine

## 2018-06-06 ENCOUNTER — Telehealth: Payer: Self-pay | Admitting: Internal Medicine

## 2018-06-06 NOTE — Telephone Encounter (Signed)
Copied from CRM [id number]. Topic: Quick Communication - Rx Refill/Question >> Jun 06, 2018  4:12 PM Amado Coe, RN wrote: Medication:    amLODipine (NORVASC) 5 MG tablet  Has the patient contacted their pharmacy? Yes.  Pt states she was told the pharmacy reached out to the office several times for the past 3 weeks with no response. Pt is a former pt of Dr. Josiah Lobo and has a new patient appt with Dr. Fuller Canada on  (Agent: If no, request that the patient contact the pharmacy for the refill.) (Agent: If yes, when and what did the pharmacy advise?)  Preferred Pharmacy (with phone number or street name): [facility] [address]  Agent: Please be advised that RX refills may take up to 3 business days. We ask that you follow-up with your pharmacy.

## 2018-06-07 ENCOUNTER — Other Ambulatory Visit: Payer: Self-pay | Admitting: Family Medicine

## 2018-06-07 MED ORDER — AMLODIPINE BESYLATE 5 MG PO TABS: ORAL_TABLET | ORAL | 2 refills | Status: DC

## 2018-06-07 NOTE — Telephone Encounter (Signed)
Last filled 05/2017. She has an upcoming appointment on 06/21/2018. Ok to fill?

## 2018-06-07 NOTE — Telephone Encounter (Signed)
I have sent in a refill of the amlodipine for her to the requested pharmacy.  Please advise her that in the future if she is not getting a response on a refill to contact us directly sooner in the process.  We will always call her if we are refusing to refill a prescription for some reason, so she has not heard from [place] it means that we have not received the pharmacy notifications.

## 2018-06-08 NOTE — Telephone Encounter (Signed)
Patient is aware 

## 2018-06-21 ENCOUNTER — Ambulatory Visit (INDEPENDENT_AMBULATORY_CARE_PROVIDER_SITE_OTHER): Payer: 59 | Admitting: Family Medicine

## 2018-06-21 ENCOUNTER — Other Ambulatory Visit: Payer: Self-pay

## 2018-06-21 ENCOUNTER — Encounter: Payer: Self-pay | Admitting: Family Medicine

## 2018-06-21 VITALS — BP 128/86 | Wt 186.4 lb

## 2018-06-21 DIAGNOSIS — E876 Hypokalemia: Secondary | ICD-10-CM | POA: Diagnosis not present

## 2018-06-21 DIAGNOSIS — I10 Essential (primary) hypertension: Secondary | ICD-10-CM | POA: Diagnosis not present

## 2018-06-21 DIAGNOSIS — G4700 Insomnia, unspecified: Secondary | ICD-10-CM

## 2018-06-21 DIAGNOSIS — F419 Anxiety disorder, unspecified: Secondary | ICD-10-CM | POA: Diagnosis not present

## 2018-06-21 DIAGNOSIS — E785 Hyperlipidemia, unspecified: Secondary | ICD-10-CM

## 2018-06-21 DIAGNOSIS — Z862 Personal history of diseases of the blood and blood-forming organs and certain disorders involving the immune mechanism: Secondary | ICD-10-CM

## 2018-06-21 MED ORDER — TRAZODONE HCL 50 MG PO TABS: 25.0000 mg | ORAL_TABLET | Freq: Every evening | ORAL | 2 refills | Status: DC | PRN

## 2018-06-21 MED ORDER — CLONAZEPAM 0.5 MG PO TABS: 0.2500 mg | ORAL_TABLET | Freq: Two times a day (BID) | ORAL | 1 refills | Status: DC | PRN

## 2018-06-21 NOTE — Progress Notes (Addendum)
Virtual Visit via Video Note  I connected with Tanya Richard on 06/21/18 at  2:30 PM EDT by a video enabled telemedicine application and verified that I am speaking with the correct person using two identifiers.  Location patient: home Location provider:work or home office Persons participating in the virtual visit: patient, provider  I discussed the limitations of evaluation and management by telemedicine and the availability of in person appointments. The patient expressed understanding and agreed to proceed.   Tanya Richard DOB: August 07, 1965 Encounter date: 06/21/2018  This isa 53 y.o. female who presents to establish care. Chief Complaint  Patient presents with  . Transitions Of Care    bp monitor is saying irreg heartbeat    History of present illness: In an effort to preserve mask that she had she sprayed it with lysol and then wore it for a few days. Has been a week since she threw away mask. She has been very worried about ingesting chemicals. Feels worried about potentially putting chemicals in body.   Not been able to eat, has lost weight not eating worrying about the lysol exposure.   Every time she tries to relax, rest, she feels on edge. Can't relax, cant eat, sleep. Not had anything for anxiety in past, but wondering if she needs something. States that she feels like she is at breaking point between stressors, virus, etc. Not had panic attacks in past, but once she worried about the lysol ingestion, she started having symptoms. Body gets hot, can't stop thinking about it. Hasn't had trouble with breathing. Just uncontrollable fear. Niece died from blood clot age 11 unexpectedly. Has had 4 family members die in last year.   Trying to stay hydrated.   Not sleeping well since niece died. Not good sleeper anyway, but in last month getting only 2 hrs/night. Not getting good, continuous sleep. Mind is racing. Can fall asleep ok, but not ever stayed asleep well.   Wakes up some  mornings with tightness in upper body (not chest) more shoulder. Thinks it is related to way that she sleeps. Not had shortness of breath. More heaviness. This has been going on for a long time. Exacerbated with the panic attacks; just feels more heavy in this area. Notes it more on right shoulder which is side that she lays on. Sensation went away last night with deep breathes. Lasted about 30 minutes. No heart racing with episode. Just felt hot all over.   HTN:on amlodopine, hctz. Tries to check bp regularly. 133/66; 115/82; 128/86 in last couple of months.  She had been instructed to stop hydrochlorothiazide after previous blood work showing hypokalemia, but she continued on medication and it has been refilled.  She has not had repeat blood work done since February 2018.  Depression episode was 20 yrs ago when brother passed away unexpectedly.  Doesn't feel skipped or missed beats. Has had occasional irregular HR rhythm since 2015. Never been symptomatic when this happens.   Went for long walk the other day, felt great. Otherwise not doing a lot of exercise on regular basis. No difficulty going up stairs. Up and down stairs multiple times/day. No SOB with this.    Past Medical History:  Diagnosis Date  . ELEVATED BP READING WITHOUT DX HYPERTENSION 10/14/2009  . NEPHROLITHIASIS, HX OF 12/21/2007  . Renal colic 12/21/2007   Past Surgical History:  Procedure Laterality Date  . COLPOSCOPY    . GYNECOLOGIC CRYOSURGERY    . MOUTH SURGERY  1984   Allergies  Allergen Reactions  . Lisinopril Swelling    Angioedema of lip see ed visit 101 6   Current Meds  Medication Sig  . amLODipine (NORVASC) 5 MG tablet TAKE 1 TABLET (5 MG TOTAL) BY MOUTH DAILY.  Marland Kitchen Cholecalciferol (D3-1000 PO) Take by mouth.  . Cyanocobalamin (B-12 PO) Take 500 mcg by mouth.  . hydrochlorothiazide (HYDRODIURIL) 25 MG tablet TAKE 1 TABLET BY MOUTH DAILY  . Multiple Vitamin (MULTIVITAMIN) capsule Take 1 capsule by mouth  daily.    . naproxen sodium (ANAPROX) 550 MG tablet Take 1 tablet (550 mg total) by mouth 2 (two) times daily with a meal.  . vitamin C (ASCORBIC ACID) 500 MG tablet Take 500 mg by mouth daily.  . [DISCONTINUED] norethindrone-ethinyl estradiol (JUNEL FE,GILDESS FE,LOESTRIN FE) 1-20 MG-MCG tablet Take 1 tablet by mouth daily.   Social History   Tobacco Use  . Smoking status: Never Smoker  . Smokeless tobacco: Never Used  Substance Use Topics  . Alcohol use: Yes    Alcohol/week: 0.0 standard drinks    Comment: rare   Family History  Problem Relation Age of Onset  . Diabetes Mother   . Other Father        DVT  . Hypertension Father   . Pulmonary embolism Father        x2  . Diabetes Maternal Aunt   . Hypertension Maternal Grandmother   . Cancer Maternal Grandmother        MGGM-Uterine cancer  . Arthritis Neg Hx        family hx     Review of Systems  Constitutional: Negative for chills, fatigue and fever.  Respiratory: Negative for cough, chest tightness, shortness of breath and wheezing.   Cardiovascular: Negative for chest pain, palpitations and leg swelling.    Objective:  BP 128/86 (BP Location: Left Arm)   Wt 186 lb 6.4 oz (84.6 kg)   BMI 32.00 kg/m   Weight: 186 lb 6.4 oz (84.6 kg)   BP Readings from Last 3 Encounters:  06/21/18 128/86  10/03/17 120/90  05/03/17 118/82   Wt Readings from Last 3 Encounters:  06/21/18 186 lb 6.4 oz (84.6 kg)  10/03/17 204 lb 6.4 oz (92.7 kg)  05/03/17 211 lb 12.8 oz (96.1 kg)    EXAM:  GENERAL: alert, oriented, appears well and in no acute distress  HEENT: atraumatic, conjunctiva clear, no obvious abnormalities on inspection of external nose and ears  NECK: normal movements of the head and neck  LUNGS: on inspection no signs of respiratory distress, breathing rate appears normal, no obvious gross SOB, gasping or wheezing  CV: no obvious cyanosis  MS: moves all visible extremities without noticeable  abnormality  PSYCH/NEURO: pleasant and cooperative; she is anxious and tears up when talking about concerns with lysol exposure.   Assessment/Plan  1. Essential hypertension Seems well controlled with home checks, but I do worry about where her potassium levels are since she has continue to use the hydrochlorothiazide.  I would like to repeat blood work.  She has concerns with getting blood work during this time of COVID pandemic, so I have asked her to consider completing in the next month.  We did discuss that we are not seeing sick people in the office or in the lab, so I do think her risk of exposure is low given our precautions, but she still has some anxiety about following through with this.  We did discuss risks of potassium abnormalities including heart rhythm abnormalities. -  Comprehensive metabolic panel; Future - CBC with Differential/Platelet; Future  2. Anxiety We discussed options for treatment of anxiety.  I do feel that this is very situational.  I reassured her that since she did not have any sort of respiratory symptoms after using mask that had been sprayed with Lysol and I do not suspect any immediate harm was done to mucosal membranes or respiratory tract.  She does not wish to be on daily medication for anxiety.  We will use Klonopin is more of an emergency medication to help with racing mind/panic sensation.  I have asked her to update me next week with anxiety status. - clonazePAM (KLONOPIN) 0.5 MG tablet; Take 0.5-1 tablets (0.25-0.5 mg total) by mouth 2 (two) times daily as needed for anxiety.  Dispense: 20 tablet; Refill: 1  3. Insomnia, unspecified type Trial of trazodone for sleep as needed.  Not been sleeping well ever since dealing with deaths in the family. - traZODone (DESYREL) 50 MG tablet; Take 0.5-1 tablets (25-50 mg total) by mouth at bedtime as needed for sleep.  Dispense: 30 tablet; Refill: 2  4. Hypokalemia See above.  Lab work has been ordered to  recheck.  5. Hyperlipidemia, unspecified hyperlipidemia type Recheck lipids - Lipid panel; Future - TSH; Future  6. History of anemia  - IBC + Ferritin; Future - Ferritin; Future - Vitamin B12; Future  Return pending bloodwork.    I discussed the assessment and treatment plan with the patient. The patient was provided an opportunity to ask questions and all were answered. The patient agreed with the plan and demonstrated an understanding of the instructions.   The patient was advised to call back or seek an in-person evaluation if the symptoms worsen or if the condition fails to improve as anticipated.  Theodis ShoveJunell Keyarra Rendall, MD   (I have asked her to find out about bloodwork results from niece with blood clot (they were doing hypercoag work up) and we can offer testing pending this knowledge. We discussed risk with ocp but she has recently stopped this)

## 2018-07-05 ENCOUNTER — Other Ambulatory Visit (INDEPENDENT_AMBULATORY_CARE_PROVIDER_SITE_OTHER): Payer: 59

## 2018-07-05 ENCOUNTER — Other Ambulatory Visit: Payer: Self-pay

## 2018-07-05 DIAGNOSIS — I10 Essential (primary) hypertension: Secondary | ICD-10-CM | POA: Diagnosis not present

## 2018-07-05 DIAGNOSIS — Z862 Personal history of diseases of the blood and blood-forming organs and certain disorders involving the immune mechanism: Secondary | ICD-10-CM | POA: Diagnosis not present

## 2018-07-05 DIAGNOSIS — E785 Hyperlipidemia, unspecified: Secondary | ICD-10-CM | POA: Diagnosis not present

## 2018-07-05 DIAGNOSIS — R7301 Impaired fasting glucose: Secondary | ICD-10-CM

## 2018-07-05 LAB — IBC + FERRITIN
Ferritin: 78.4 ng/mL (ref 10.0–291.0)
Iron: 61 ug/dL (ref 42–145)
Saturation Ratios: 17.4 % — ABNORMAL LOW (ref 20.0–50.0)
Transferrin: 251 mg/dL (ref 212.0–360.0)

## 2018-07-05 LAB — CBC WITH DIFFERENTIAL/PLATELET
Basophils Absolute: 0.1 10*3/uL (ref 0.0–0.1)
Basophils Relative: 0.7 % (ref 0.0–3.0)
Eosinophils Absolute: 0.1 10*3/uL (ref 0.0–0.7)
Eosinophils Relative: 0.6 % (ref 0.0–5.0)
HCT: 37.3 % (ref 36.0–46.0)
Hemoglobin: 12.7 g/dL (ref 12.0–15.0)
Lymphocytes Relative: 23.2 % (ref 12.0–46.0)
Lymphs Abs: 1.9 10*3/uL (ref 0.7–4.0)
MCHC: 33.9 g/dL (ref 30.0–36.0)
MCV: 86.8 fl (ref 78.0–100.0)
Monocytes Absolute: 0.3 10*3/uL (ref 0.1–1.0)
Monocytes Relative: 3.6 % (ref 3.0–12.0)
Neutro Abs: 5.9 10*3/uL (ref 1.4–7.7)
Neutrophils Relative %: 71.9 % (ref 43.0–77.0)
Platelets: 328 10*3/uL (ref 150.0–400.0)
RBC: 4.3 Mil/uL (ref 3.87–5.11)
RDW: 14.5 % (ref 11.5–15.5)
WBC: 8.3 10*3/uL (ref 4.0–10.5)

## 2018-07-05 LAB — HEMOGLOBIN A1C: Hgb A1c MFr Bld: 5.9 % (ref 4.6–6.5)

## 2018-07-05 LAB — LIPID PANEL
Cholesterol: 179 mg/dL (ref 0–200)
HDL: 62.4 mg/dL (ref >39.00)
LDL Cholesterol: 103 mg/dL — ABNORMAL HIGH (ref 0–99)
NonHDL: 117.02
Total CHOL/HDL Ratio: 3
Triglycerides: 70 mg/dL (ref 0.0–149.0)
VLDL: 14 mg/dL (ref 0.0–40.0)

## 2018-07-05 LAB — COMPREHENSIVE METABOLIC PANEL
ALT: 17 U/L (ref 0–35)
AST: 17 U/L (ref 0–37)
Albumin: 3.9 g/dL (ref 3.5–5.2)
Alkaline Phosphatase: 90 U/L (ref 39–117)
BUN: 11 mg/dL (ref 6–23)
CO2: 31 mEq/L (ref 19–32)
Calcium: 9.4 mg/dL (ref 8.4–10.5)
Chloride: 101 mEq/L (ref 96–112)
Creatinine, Ser: 0.59 mg/dL (ref 0.40–1.20)
GFR: 130.29 mL/min (ref >60.00)
Glucose, Bld: 127 mg/dL — ABNORMAL HIGH (ref 70–99)
Potassium: 3.3 mEq/L — ABNORMAL LOW (ref 3.5–5.1)
Sodium: 143 mEq/L (ref 135–145)
Total Bilirubin: 0.5 mg/dL (ref 0.2–1.2)
Total Protein: 7.2 g/dL (ref 6.0–8.3)

## 2018-07-05 LAB — FERRITIN: Ferritin: 78.4 ng/mL (ref 10.0–291.0)

## 2018-07-05 LAB — TSH: TSH: 0.97 u[IU]/mL (ref 0.35–4.50)

## 2018-07-05 LAB — VITAMIN B12: Vitamin B-12: 1329 pg/mL — ABNORMAL HIGH (ref 211–911)

## 2018-07-06 ENCOUNTER — Other Ambulatory Visit: Payer: 59

## 2018-07-11 MED ORDER — HYDROCHLOROTHIAZIDE 25 MG PO TABS: 25.0000 mg | ORAL_TABLET | Freq: Every day | ORAL | 0 refills | Status: DC

## 2018-07-11 MED ORDER — POTASSIUM CHLORIDE ER 10 MEQ PO TBCR: 10.0000 meq | EXTENDED_RELEASE_TABLET | Freq: Every day | ORAL | 0 refills | Status: DC

## 2018-07-11 NOTE — Addendum Note (Signed)
Addended by: Johnella Moloney on: 07/11/2018 10:37 AM   Modules accepted: Orders

## 2018-07-13 ENCOUNTER — Other Ambulatory Visit: Payer: Self-pay | Admitting: Family Medicine

## 2018-07-13 DIAGNOSIS — G4700 Insomnia, unspecified: Secondary | ICD-10-CM

## 2018-09-03 ENCOUNTER — Emergency Department (HOSPITAL_BASED_OUTPATIENT_CLINIC_OR_DEPARTMENT_OTHER)
Admission: EM | Admit: 2018-09-03 | Discharge: 2018-09-03 | Disposition: A | Discharge status: Home / Self Care | Payer: 59 | Attending: Emergency Medicine | Admitting: Emergency Medicine

## 2018-09-03 ENCOUNTER — Emergency Department (HOSPITAL_BASED_OUTPATIENT_CLINIC_OR_DEPARTMENT_OTHER): Payer: 59

## 2018-09-03 ENCOUNTER — Encounter (HOSPITAL_BASED_OUTPATIENT_CLINIC_OR_DEPARTMENT_OTHER): Payer: Self-pay | Admitting: Emergency Medicine

## 2018-09-03 ENCOUNTER — Other Ambulatory Visit: Payer: Self-pay

## 2018-09-03 DIAGNOSIS — Z79899 Other long term (current) drug therapy: Secondary | ICD-10-CM | POA: Diagnosis not present

## 2018-09-03 DIAGNOSIS — I10 Essential (primary) hypertension: Secondary | ICD-10-CM | POA: Insufficient documentation

## 2018-09-03 DIAGNOSIS — M79661 Pain in right lower leg: Secondary | ICD-10-CM

## 2018-09-03 NOTE — Discharge Instructions (Addendum)
Ibuprofen 600 mg every 6 hours as needed for pain.  Rest.  Follow-up with your primary doctor if symptoms or not improving in the next week, and return to the ER if symptoms significantly worsen or change.

## 2018-09-03 NOTE — ED Triage Notes (Addendum)
Pt has had multiple family members with blood clots and one just passed away recently and she is very frightened she may have a blood clot in her right calf and would like tests to rule it out. States she is in no great discomfort and hoping to catch it early. Pt reports anxiety over corona virus and being here. She has been self isolating for months to stay safe. She would like mental peace of mind today.

## 2018-09-03 NOTE — ED Notes (Signed)
Pt prefers not to touch sig pad but understands D/C instructions.

## 2018-09-03 NOTE — ED Provider Notes (Signed)
[facility] EMERGENCY DEPARTMENT Provider Note   CSN: [id number] Arrival date & time: 09/03/18  [id number]     History   Chief Complaint No chief complaint on file.   HPI Tanya Richard is a 53 y.o. female.     Patient is a 53 year old female with history of renal calculi.  She presents today for evaluation of right calf pain.  This is been present for the past several days.  She describes a pulling sensation when she is ambulating, however has no discomfort at rest.  She denies any chest pain or shortness of breath.  She denies any fevers or chills.  She denies any injury or trauma, but does state that she has been exercising more recently.  The history is provided by the patient.    Past Medical History:  Diagnosis Date  . ELEVATED BP READING WITHOUT DX HYPERTENSION 10/14/2009  . NEPHROLITHIASIS, HX OF 12/21/2007  . Renal colic 12/21/2007    Patient Active Problem List   Diagnosis Date Noted  . Obesity 08/10/2013  . CIN I (cervical intraepithelial neoplasia I)   . Essential hypertension 10/28/2009  . RENAL COLIC 12/21/2007  . NEPHROLITHIASIS, HX OF 12/21/2007  . DEPRESSION 10/19/2006    Past Surgical History:  Procedure Laterality Date  . COLPOSCOPY    . GYNECOLOGIC CRYOSURGERY    . MOUTH SURGERY  1984     OB History    Gravida  0   Para      Term      Preterm      AB      Living        SAB      TAB      Ectopic      Multiple      Live Births               Home Medications    Prior to Admission medications   Medication Sig Start Date End Date Taking? Authorizing Provider  amLODipine (NORVASC) 5 MG tablet TAKE 1 TABLET (5 MG TOTAL) BY MOUTH DAILY. 06/07/18  Yes Koberlein, Paris LoreJunell C, MD  Cholecalciferol (D3-1000 PO) Take by mouth.   Yes [provider]  Cyanocobalamin (B-12 PO) Take 500 mcg by mouth.   Yes [provider]  hydrochlorothiazide (HYDRODIURIL) 25 MG tablet Take 1 tablet (25 mg total) by mouth daily.  07/11/18  Yes Koberlein, Junell C, MD  loratadine (CLARITIN) 10 MG tablet Take 10 mg by mouth daily as needed.    Yes [provider]  Multiple Vitamin (MULTIVITAMIN) capsule Take 1 capsule by mouth daily.     Yes [provider]  naproxen sodium (ANAPROX) 550 MG tablet Take 1 tablet (550 mg total) by mouth 2 (two) times daily with a meal. 10/03/17  Yes Harrington ChallengerYoung, Nancy J, NP  vitamin C (ASCORBIC ACID) 500 MG tablet Take 500 mg by mouth daily.   Yes [provider]  clonazePAM (KLONOPIN) 0.5 MG tablet Take 0.5-1 tablets (0.25-0.5 mg total) by mouth 2 (two) times daily as needed for anxiety. 06/21/18   Wynn BankerKoberlein, Junell C, MD  potassium chloride (K-DUR) 10 MEQ tablet Take 1 tablet (10 mEq total) by mouth daily. 07/11/18   Wynn BankerKoberlein, Junell C, MD  traZODone (DESYREL) 50 MG tablet Take 0.5-1 tablets (25-50 mg total) by mouth at bedtime as needed for sleep. 06/21/18   Wynn BankerKoberlein, Junell C, MD    Family History Family History  Problem Relation Age of Onset  . Diabetes Mother   .  Other Father        DVT  . Hypertension Father   . Pulmonary embolism Father        x2  . Diabetes Maternal Aunt   . Hypertension Maternal Grandmother   . Cancer Maternal Grandmother        MGGM-Uterine cancer  . Arthritis Neg Hx        family hx    Social History Social History   Tobacco Use  . Smoking status: Never Smoker  . Smokeless tobacco: Never Used  Substance Use Topics  . Alcohol use: Yes    Alcohol/week: 0.0 standard drinks    Comment: rare  . Drug use: No     Allergies   Lisinopril   Review of Systems Review of Systems  All other systems reviewed and are negative.    Physical Exam Updated Vital Signs BP (!) 122/93 (BP Location: Right Arm)   Pulse (!) 114   Temp 98.3 F (36.8 C) (Oral)   Resp (!) 22   SpO2 100%   Physical Exam Vitals signs and nursing note reviewed.  Constitutional:      General: She is not in acute distress.    Appearance: Normal appearance.  She is not ill-appearing, toxic-appearing or diaphoretic.  HENT:     Head: Normocephalic and atraumatic.  Pulmonary:     Effort: Pulmonary effort is normal.  Musculoskeletal:     Comments: The right lower extremity appears grossly normal in comparison to the right.  There is no swelling or edema.  DP pulses are easily palpable.  There is no palpable cord.  Homans sign is absent.  Skin:    General: Skin is warm and dry.  Neurological:     Mental Status: She is alert.      ED Treatments / Results  Labs (all labs ordered are listed, but only abnormal results are displayed) Labs Reviewed - No data to display  EKG    Radiology No results found.  Procedures Procedures (including critical care time)  Medications Ordered in ED Medications - No data to display   Initial Impression / Assessment and Plan / ED Course  I have reviewed the triage vital signs and the nursing notes.  Pertinent labs & imaging results that were available during my care of the patient were reviewed by me and considered in my medical decision making (see chart for details).  Patient presenting here with complaints of right calf pain.  She has concerned she may have a DVT as she has had other family members with this.  Her physical examination is unremarkable and ultrasound shows no evidence for DVT.  I suspect a musculoskeletal etiology.  Patient will be advised to rest, take ibuprofen, and follow-up with primary doctor if not improving in the next week.  Final Clinical Impressions(s) / ED Diagnoses   Final diagnoses:  None    ED Discharge Orders    None       Veryl Speak, MD 09/03/18 1044

## 2018-09-11 ENCOUNTER — Encounter: Payer: Self-pay | Admitting: Family Medicine

## 2018-09-11 ENCOUNTER — Other Ambulatory Visit: Payer: Self-pay

## 2018-09-11 ENCOUNTER — Ambulatory Visit (INDEPENDENT_AMBULATORY_CARE_PROVIDER_SITE_OTHER): Payer: 59 | Admitting: Family Medicine

## 2018-09-11 VITALS — BP 118/82 | HR 92 | Temp 98.9°F | Ht 64.0 in | Wt 186.3 lb

## 2018-09-11 DIAGNOSIS — E876 Hypokalemia: Secondary | ICD-10-CM

## 2018-09-11 DIAGNOSIS — R002 Palpitations: Secondary | ICD-10-CM | POA: Diagnosis not present

## 2018-09-11 DIAGNOSIS — M545 Low back pain, unspecified: Secondary | ICD-10-CM

## 2018-09-11 LAB — BASIC METABOLIC PANEL
BUN: 14 mg/dL (ref 6–23)
CO2: 29 mEq/L (ref 19–32)
Calcium: 9.4 mg/dL (ref 8.4–10.5)
Chloride: 101 mEq/L (ref 96–112)
Creatinine, Ser: 0.56 mg/dL (ref 0.40–1.20)
GFR: 138.27 mL/min (ref >60.00)
Glucose, Bld: 114 mg/dL — ABNORMAL HIGH (ref 70–99)
Potassium: 3.1 mEq/L — ABNORMAL LOW (ref 3.5–5.1)
Sodium: 141 mEq/L (ref 135–145)

## 2018-09-11 MED ORDER — POTASSIUM CHLORIDE ER 10 MEQ PO TBCR: EXTENDED_RELEASE_TABLET | ORAL | 1 refills | Status: DC

## 2018-09-11 NOTE — Progress Notes (Addendum)
Tanya Richard DOB: 1965/09/03 Encounter date: 09/11/2018  This is a 53 y.o. female who presents with Chief Complaint  Patient presents with  . Palpitations    History of present illness:  She feels ok. No pain, but notices that as she is continuing to check blood pressure she keeps getting notice of "irregular heart beat" when she checks her blood pressure. This has been going on for years.   Day she sent mychart message - she states that she actually felt there was change in heartbeat - almost like a hesitation of beat. Just noticed it more when she hadn't noticed in past. Noted just more momentarily. Similar to what she felt in past, but just wanted to get updated EKG.   Sometimes states she will feel heavy in chest - almost like chest is weighted down. No trouble with breathing, no pain. Notes heaviness when she first wakes up. Notes that when she is doing her "self assessment" in morning.   Sometimes when going to stand up will get catch in right hip/shooting pain. Has progressed to ache in lower back, but sometimes notes higher. Not sure if radiating. Not all the time. Comes at different times. Not brought on with activity. Right now just slight dull pain across shoulders or lower back. Not bothering her too much, but heat does seem to help. No pain down arm; no shooting pain. No jaw pain.   None of above symptoms are consistent.   Takes pepcid daily. Does have some heartburn without this when she eats dinner. Prior to pepcid she would wake with heartburn sx.   Few headaches - but seem more sinus related and not consistent.   HR usually in mid to high 80's; occasionally 90.   Was talking with friends about aching in legs and restless sleep - wondered about magnesium levels being low.     Allergies  Allergen Reactions  . Lisinopril Swelling    Angioedema of lip see ed visit 101 6   Current Meds  Medication Sig  . amLODipine (NORVASC) 5 MG tablet TAKE 1 TABLET (5 MG TOTAL) BY  MOUTH DAILY.  Marland Kitchen CALCIUM PO Take by mouth.  . Cholecalciferol (D3-1000 PO) Take by mouth.  . clonazePAM (KLONOPIN) 0.5 MG tablet Take 0.5-1 tablets (0.25-0.5 mg total) by mouth 2 (two) times daily as needed for anxiety.  . Cyanocobalamin (B-12 PO) Take 500 mcg by mouth.  . hydrochlorothiazide (HYDRODIURIL) 25 MG tablet Take 1 tablet (25 mg total) by mouth daily.  Marland Kitchen loratadine (CLARITIN) 10 MG tablet Take 10 mg by mouth daily as needed.   . Multiple Vitamin (MULTIVITAMIN) capsule Take 1 capsule by mouth daily.    . naproxen sodium (ANAPROX) 550 MG tablet Take 1 tablet (550 mg total) by mouth 2 (two) times daily with a meal.  . Omega-3 Fatty Acids (FISH OIL PO) Take by mouth.  . potassium chloride (K-DUR) 10 MEQ tablet Take 1 tablet (10 mEq total) by mouth daily.  . traZODone (DESYREL) 50 MG tablet Take 0.5-1 tablets (25-50 mg total) by mouth at bedtime as needed for sleep.  . vitamin C (ASCORBIC ACID) 500 MG tablet Take 500 mg by mouth daily.    Review of Systems  Constitutional: Negative for chills, fatigue and fever.  Respiratory: Negative for cough, chest tightness, shortness of breath and wheezing.   Cardiovascular: Negative for chest pain, palpitations and leg swelling.  Musculoskeletal: Positive for back pain.  Neurological: Positive for numbness (feels like there is chronic issue of  numbness with lower extremities. does feel like this has improved since decreasing B12).  Psychiatric/Behavioral: Positive for sleep disturbance. The patient is nervous/anxious (always been anxious; worse since niece passed away).     Objective:  BP 118/82 (BP Location: Left Arm, Patient Position: Sitting, Cuff Size: Normal)   Pulse 92   Temp 98.9 F (37.2 C) (Oral)   Ht 5\' 4"  (1.626 m)   Wt 186 lb 4.8 oz (84.5 kg)   SpO2 98%   BMI 31.98 kg/m   Weight: 186 lb 4.8 oz (84.5 kg)   BP Readings from Last 3 Encounters:  09/11/18 118/82  09/03/18 (!) 122/93  06/21/18 128/86   Wt Readings from Last 3  Encounters:  09/11/18 186 lb 4.8 oz (84.5 kg)  06/21/18 186 lb 6.4 oz (84.6 kg)  10/03/17 204 lb 6.4 oz (92.7 kg)    Physical Exam Constitutional:      General: She is not in acute distress.    Appearance: She is well-developed.  Neck:     Thyroid: No thyroid mass or thyromegaly.  Cardiovascular:     Rate and Rhythm: Normal rate and regular rhythm. Frequent extrasystoles are present.    Pulses: Normal pulses.     Heart sounds: Normal heart sounds. No murmur. No friction rub.  Pulmonary:     Effort: Pulmonary effort is normal. No respiratory distress.     Breath sounds: Normal breath sounds. No wheezing or rales.  Musculoskeletal:     Right lower leg: No edema.     Left lower leg: No edema.     Comments: No spinal tenderness. There is some lower paralumbar tenderness to palpation; parathoracic tenderness right with slight spasm (very mild). Full ROM back. Right SI tenderness,mild. No limited ROM of right hip or tenderness with ROM.   Mild right lower sternal tenderness to palpation.  Neurological:     Mental Status: She is alert and oriented to person, place, and time.  Psychiatric:        Behavior: Behavior normal.     Assessment/Plan  1. Palpitations We will recheck BMP since she was previously hypokalemic.  We discussed triggers for palpitations, and she tends to avoid most of these except for Tea.  EKG appears stable from previous; sinus rhythm. No PAC's noted on current EKG (present on past). No acute ST elevation/depression.  If continues to have palpitations or noting increased frequency would consider Holter monitor.  Follow-up pending lab work results. - Magnesium, RBC - Basic metabolic panel; Future - EKG 12-Lead - Basic metabolic panel  2. Midline low back pain without sciatica, unspecified chronicity Pain is mild and intermittent.  Exam is stable and I suspect simple musculoskeletal cause.  We discussed importance of maintaining flexibility.  Certainly there is  always benefit from increased core strengthening.  We discussed that at this time there is no need for imaging since pain is intermittent.   Return for pending bloodwork results. Of note, potassium level did come back prior to completion of this note and potassium was low.  We will replace this.  This may help with some of ongoing symptoms.  Further follow-up plans pending magnesium result.   Theodis ShoveJunell , MD

## 2018-09-11 NOTE — Patient Instructions (Signed)

## 2018-09-13 LAB — MAGNESIUM, RBC: Magnesium RBC: 5.8 mg/dL (ref 4.0–6.4)

## 2018-09-13 LAB — EXTRA LAV TOP TUBE

## 2018-09-15 ENCOUNTER — Telehealth: Payer: Self-pay | Admitting: Family Medicine

## 2018-09-15 NOTE — Telephone Encounter (Signed)
Patient is returning Jo's call regarding lab results. Please advise Thank you [phone]

## 2018-09-15 NOTE — Addendum Note (Signed)
Addended by: Agnes Lawrence on: 09/15/2018 04:11 PM   Modules accepted: Orders

## 2018-09-18 ENCOUNTER — Encounter: Payer: Self-pay | Admitting: Family Medicine

## 2018-09-18 NOTE — Telephone Encounter (Signed)
See results note. 

## 2018-10-09 ENCOUNTER — Other Ambulatory Visit: Payer: Self-pay

## 2018-10-09 ENCOUNTER — Ambulatory Visit (INDEPENDENT_AMBULATORY_CARE_PROVIDER_SITE_OTHER): Payer: 59 | Admitting: Women's Health

## 2018-10-09 ENCOUNTER — Encounter: Payer: Self-pay | Admitting: Women's Health

## 2018-10-09 VITALS — BP 120/82 | Ht 64.0 in | Wt 186.0 lb

## 2018-10-09 DIAGNOSIS — Z01419 Encounter for gynecological examination (general) (routine) without abnormal findings: Secondary | ICD-10-CM

## 2018-10-09 DIAGNOSIS — Z8249 Family history of ischemic heart disease and other diseases of the circulatory system: Secondary | ICD-10-CM | POA: Diagnosis not present

## 2018-10-09 NOTE — Patient Instructions (Addendum)
Vit D 2000 iu  Daily   Health Maintenance for Postmenopausal Women Menopause is a normal process in which your ability to get pregnant comes to an end. This process happens slowly over many months or years, usually between the ages of 52 and 72. Menopause is complete when you have missed your menstrual periods for 12 months. It is important to talk with your health care provider about some of the most common conditions that affect women after menopause (postmenopausal women). These include heart disease, cancer, and bone loss (osteoporosis). Adopting a healthy lifestyle and getting preventive care can help to promote your health and wellness. The actions you take can also lower your chances of developing some of these common conditions. What should I know about menopause? During menopause, you may get a number of symptoms, such as:  Hot flashes. These can be moderate or severe.  Night sweats.  Decrease in sex drive.  Mood swings.  Headaches.  Tiredness.  Irritability.  Memory problems.  Insomnia. Choosing to treat or not to treat these symptoms is a decision that you make with your health care provider. Do I need hormone replacement therapy?  Hormone replacement therapy is effective in treating symptoms that are caused by menopause, such as hot flashes and night sweats.  Hormone replacement carries certain risks, especially as you become older. If you are thinking about using estrogen or estrogen with progestin, discuss the benefits and risks with your health care provider. What is my risk for heart disease and stroke? The risk of heart disease, heart attack, and stroke increases as you age. One of the causes may be a change in the body's hormones during menopause. This can affect how your body uses dietary fats, triglycerides, and cholesterol. Heart attack and stroke are medical emergencies. There are many things that you can do to help prevent heart disease and stroke. Watch your  blood pressure  High blood pressure causes heart disease and increases the risk of stroke. This is more likely to develop in people who have high blood pressure readings, are of African descent, or are overweight.  Have your blood pressure checked: ? Every 3-5 years if you are 27-27 years of age. ? Every year if you are 69 years old or older. Eat a healthy diet   Eat a diet that includes plenty of vegetables, fruits, low-fat dairy products, and lean protein.  Do not eat a lot of foods that are high in solid fats, added sugars, or sodium. Get regular exercise Get regular exercise. This is one of the most important things you can do for your health. Most adults should:  Try to exercise for at least 150 minutes each week. The exercise should increase your heart rate and make you sweat (moderate-intensity exercise).  Try to do strengthening exercises at least twice each week. Do these in addition to the moderate-intensity exercise.  Spend less time sitting. Even light physical activity can be beneficial. Other tips  Work with your health care provider to achieve or maintain a healthy weight.  Do not use any products that contain nicotine or tobacco, such as cigarettes, e-cigarettes, and chewing tobacco. If you need help quitting, ask your health care provider.  Know your numbers. Ask your health care provider to check your cholesterol and your blood sugar (glucose). Continue to have your blood tested as directed by your health care provider. Do I need screening for cancer? Depending on your health history and family history, you may need to have cancer  screening at different stages of your life. This may include screening for:  Breast cancer.  Cervical cancer.  Lung cancer.  Colorectal cancer. What is my risk for osteoporosis? After menopause, you may be at increased risk for osteoporosis. Osteoporosis is a condition in which bone destruction happens more quickly than new bone  creation. To help prevent osteoporosis or the bone fractures that can happen because of osteoporosis, you may take the following actions:  If you are 39-50 years old, get at least 1,000 mg of calcium and at least 600 mg of vitamin D per day.  If you are older than age 80 but younger than age 77, get at least 1,200 mg of calcium and at least 600 mg of vitamin D per day.  If you are older than age 70, get at least 1,200 mg of calcium and at least 800 mg of vitamin D per day. Smoking and drinking excessive alcohol increase the risk of osteoporosis. Eat foods that are rich in calcium and vitamin D, and do weight-bearing exercises several times each week as directed by your health care provider. How does menopause affect my mental health? Depression may occur at any age, but it is more common as you become older. Common symptoms of depression include:  Low or sad mood.  Changes in sleep patterns.  Changes in appetite or eating patterns.  Feeling an overall lack of motivation or enjoyment of activities that you previously enjoyed.  Frequent crying spells. Talk with your health care provider if you think that you are experiencing depression. General instructions See your health care provider for regular wellness exams and vaccines. This may include:  Scheduling regular health, dental, and eye exams.  Getting and maintaining your vaccines. These include: ? Influenza vaccine. Get this vaccine each year before the flu season begins. ? Pneumonia vaccine. ? Shingles vaccine. ? Tetanus, diphtheria, and pertussis (Tdap) booster vaccine. Your health care provider may also recommend other immunizations. Tell your health care provider if you have ever been abused or do not feel safe at home. Summary  Menopause is a normal process in which your ability to get pregnant comes to an end.  This condition causes hot flashes, night sweats, decreased interest in sex, mood swings, headaches, or lack of  sleep.  Treatment for this condition may include hormone replacement therapy.  Take actions to keep yourself healthy, including exercising regularly, eating a healthy diet, watching your weight, and checking your blood pressure and blood sugar levels.  Get screened for cancer and depression. Make sure that you are up to date with all your vaccines. This information is not intended to replace advice given to you by your health care provider. Make sure you discuss any questions you have with your health care provider. Document Released: 04/16/2005 Document Revised: 02/15/2018 Document Reviewed: 02/15/2018 Elsevier Patient Education  2020 [employer].

## 2018-10-09 NOTE — Progress Notes (Signed)
Tanya Richard [birth year] [id number]    History:    Presents for annual exam.  Stopped  Loestrin in the past year and has had no cycle since February 2020.  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  Normal mammogram.  Reports a very stressful year.  51 year old niece died of a blood clot and then her other niece  also had blood clots.  Reports father had DVTs years ago.  Mother diabetes, CLL doing well.  Primary care manages hypertension.  Has lost 15 pounds in the past year due to stress.  Not sexually active in years.  Past medical history, past surgical history, family history and social history were all reviewed and documented in the EPIC chart.  Works from home in [employer].  ROS:  A ROS was performed and pertinent positives and negatives are included.  Exam:  Vitals:   10/09/18 1537  BP: 120/82  Weight: 186 lb (84.4 kg)  Height: 5\' 4"  (1.626 m)   Body mass index is 31.93 kg/m.   General appearance:  Normal Thyroid:  Symmetrical, normal in size, without palpable masses or nodularity. Respiratory  Auscultation:  Clear without wheezing or rhonchi Cardiovascular  Auscultation:  Regular rate, without rubs, murmurs or gallops  Edema/varicosities:  Not grossly evident Abdominal  Soft,nontender, without masses, guarding or rebound.  Liver/spleen:  No organomegaly noted  Hernia:  None appreciated  Skin  Inspection:  Grossly normal   Breasts: Examined lying and sitting.     Right: Without masses, retractions, discharge or axillary adenopathy.     Left: Without masses, retractions, discharge or axillary adenopathy. Gentitourinary   Inguinal/mons:  Normal without inguinal adenopathy  External genitalia:  Normal  BUS/Urethra/Skene's glands:  Normal  Vagina:  Normal  Cervix:  Normal  Uterus:   normal in size, shape and contour.  Midline and mobile  Adnexa/parametria:     Rt: Without masses or tenderness.   Lt: Without masses or tenderness.  Anus and perineum: Normal  Digital rectal  exam: Normal sphincter tone without palpated masses or tenderness  Assessment/Plan:  53 y.o. SBF G0 for annual exam with no GYN complaints.  Perimenopausal last cycle 04/2018/no HRT/not sexually active  Hypertension-primary care manages labs and meds Situational stress/anxiety-COVID,  25 year old niece deceased from blood clot, second niece blood clots Obesity 1986 CIN-1 normal Paps after  Plan: Factor V Leiden, SBE's, annual screening mammogram, calcium rich foods, vitamin D 1000 daily encouraged.  Menopause reviewed, instructed to call if further bleeding..  Reviewed importance of weightbearing and balance type exercise, self-care and leisure activities.  Aware of need to decrease calories/carbs.  Reviewed counseling for situational stress, declines at this time.  Screening colonoscopy, [facility] information given.  Pap, request Pap annually.  Elon Alas [facility], 3:46 PM 10/09/2018

## 2018-10-10 LAB — PAP IG W/ RFLX HPV ASCU

## 2018-10-13 LAB — FACTOR 5 LEIDEN

## 2018-10-15 ENCOUNTER — Encounter: Payer: Self-pay | Admitting: Women's Health

## 2018-10-23 ENCOUNTER — Other Ambulatory Visit: Payer: Self-pay | Admitting: Family Medicine

## 2018-11-15 ENCOUNTER — Encounter: Payer: Self-pay | Admitting: Family Medicine

## 2018-11-18 ENCOUNTER — Other Ambulatory Visit: Payer: Self-pay | Admitting: Family Medicine

## 2018-11-18 DIAGNOSIS — Z1211 Encounter for screening for malignant neoplasm of colon: Secondary | ICD-10-CM

## 2018-11-21 ENCOUNTER — Encounter: Payer: Self-pay | Admitting: Internal Medicine

## 2018-12-11 ENCOUNTER — Encounter: Payer: Self-pay | Admitting: Family Medicine

## 2018-12-11 ENCOUNTER — Other Ambulatory Visit: Payer: Self-pay | Admitting: Family Medicine

## 2018-12-11 DIAGNOSIS — R0789 Other chest pain: Secondary | ICD-10-CM

## 2018-12-11 DIAGNOSIS — M545 Low back pain, unspecified: Secondary | ICD-10-CM

## 2018-12-11 DIAGNOSIS — G8929 Other chronic pain: Secondary | ICD-10-CM

## 2018-12-19 ENCOUNTER — Ambulatory Visit (AMBULATORY_SURGERY_CENTER): Payer: 59 | Admitting: *Deleted

## 2018-12-19 ENCOUNTER — Other Ambulatory Visit: Payer: Self-pay

## 2018-12-19 VITALS — Temp 97.7°F | Ht 64.0 in | Wt 188.0 lb

## 2018-12-19 DIAGNOSIS — Z1211 Encounter for screening for malignant neoplasm of colon: Secondary | ICD-10-CM

## 2018-12-19 DIAGNOSIS — Z1159 Encounter for screening for other viral diseases: Secondary | ICD-10-CM

## 2018-12-19 NOTE — Progress Notes (Signed)
No egg or soy allergy known to patient  No issues with past sedation with any surgeries  or procedures, no intubation problems  No diet pills per patient No home 02 use per patient  No blood thinners per patient  Pt denies issues with constipation  No A fib or A flutter  EMMI video sent to pt's e mail   covid test per pt on 10-22 Thursday 8 am   Due to the COVID-19 pandemic we are asking patients to follow these guidelines. Please only bring one care partner. Please be aware that your care partner may wait in the car in the parking lot or if they feel like they will be too hot to wait in the car, they may wait in the lobby on the 4th floor. All care partners are required to wear a mask the entire time (we do not have any that we can provide them), they need to practice social distancing, and we will do a Covid check for all patient's and care partners when you arrive. Also we will check their temperature and your temperature. If the care partner waits in their car they need to stay in the parking lot the entire time and we will call them on their cell phone when the patient is ready for discharge so they can bring the car to the front of the building. Also all patient's will need to wear a mask into building.

## 2018-12-21 ENCOUNTER — Ambulatory Visit: Payer: 59

## 2018-12-22 ENCOUNTER — Encounter: Payer: Self-pay | Admitting: Internal Medicine

## 2018-12-28 ENCOUNTER — Other Ambulatory Visit: Payer: Self-pay | Admitting: Internal Medicine

## 2018-12-30 LAB — SARS CORONAVIRUS 2 (TAT 6-24 HRS): SARS Coronavirus 2: NEGATIVE

## 2019-01-01 ENCOUNTER — Telehealth: Payer: Self-pay

## 2019-01-01 NOTE — Telephone Encounter (Signed)
Covid-19 screening questions   Do you now or have you had a fever in the last 14 days?  Do you have any respiratory symptoms of shortness of breath or cough now or in the last 14 days?  Do you have any family members or close contacts with diagnosed or suspected Covid-19 in the past 14 days?  Have you been tested for Covid-19 and found to be positive?       

## 2019-01-02 ENCOUNTER — Encounter: Payer: Self-pay | Admitting: Internal Medicine

## 2019-01-02 ENCOUNTER — Telehealth: Payer: Self-pay

## 2019-01-02 ENCOUNTER — Other Ambulatory Visit: Payer: Self-pay

## 2019-01-02 ENCOUNTER — Ambulatory Visit (AMBULATORY_SURGERY_CENTER): Payer: 59 | Admitting: Internal Medicine

## 2019-01-02 VITALS — BP 120/82 | HR 81 | Temp 99.0°F | Resp 23 | Ht 64.0 in | Wt 188.0 lb

## 2019-01-02 DIAGNOSIS — Z1211 Encounter for screening for malignant neoplasm of colon: Secondary | ICD-10-CM

## 2019-01-02 MED ORDER — SODIUM CHLORIDE 0.9 % IV SOLN: 500.0000 mL | Freq: Once | INTRAVENOUS | Status: DC

## 2019-01-02 NOTE — Patient Instructions (Signed)
No polyps or cancer seen.  You do have diverticulosis - thickened muscle rings and pouches in the colon wall. Please read the handout about this condition.  Next routine colonoscopy or other screening test in 10 years - 2030  I appreciate the opportunity to care for you. Gatha Mayer, MD, [facility]

## 2019-01-02 NOTE — Telephone Encounter (Signed)
Follow up call attempted.  NALM  

## 2019-01-02 NOTE — Progress Notes (Signed)
Report given to PACU, vss 

## 2019-01-02 NOTE — Progress Notes (Signed)
Pt's states no medical or surgical changes since previsit or office visit.  Vital signs-CW  TEMP-LC

## 2019-01-02 NOTE — Op Note (Signed)
[facility] Patient Name: Tanya Richard Procedure Date: 01/02/2019 9:15 AM MRN: [id number] Endoscopist: Iva Boop , MD Age: 53 Referring MD:  Date of Birth: [birth year] Gender: Female Account #: [id number] Procedure:                Colonoscopy Indications:              Screening for colorectal malignant neoplasm, This                            is the patient's first colonoscopy Medicines:                Propofol per Anesthesia, Monitored Anesthesia Care Procedure:                Pre-Anesthesia Assessment:                           - Prior to the procedure, a History and Physical                            was performed, and patient medications and                            allergies were reviewed. The patient's tolerance of                            previous anesthesia was also reviewed. The risks                            and benefits of the procedure and the sedation                            options and risks were discussed with the patient.                            All questions were answered, and informed consent                            was obtained. Prior Anticoagulants: The patient has                            taken no previous anticoagulant or antiplatelet                            agents. ASA Grade Assessment: II - A patient with                            mild systemic disease. After reviewing the risks                            and benefits, the patient was deemed in                            satisfactory condition to undergo the procedure.  After obtaining informed consent, the colonoscope                            was passed under direct vision. Throughout the                            procedure, the patient's blood pressure, pulse, and                            oxygen saturations were monitored continuously. The                            Colonoscope was introduced through the anus and   advanced to the the cecum, identified by                            appendiceal orifice and ileocecal valve. The                            colonoscopy was performed without difficulty. The                            patient tolerated the procedure well. The quality                            of the bowel preparation was good. The bowel                            preparation used was Miralax via split dose                            instruction. The ileocecal valve, appendiceal                            orifice, and rectum were photographed. Scope In: 9:28:16 AM Scope Out: 9:37:04 AM Scope Withdrawal Time: 0 hours 6 minutes 45 seconds  Total Procedure Duration: 0 hours 8 minutes 48 seconds  Findings:                 The perianal and digital rectal examinations were                            normal.                           Multiple diverticula were found in the sigmoid                            colon, descending colon and ascending colon.                           The exam was otherwise without abnormality on                            direct and retroflexion views. Complications:  No immediate complications. Estimated Blood Loss:     Estimated blood loss: none. Impression:               - Diverticulosis in the sigmoid colon, in the                            descending colon and in the ascending colon.                           - The examination was otherwise normal on direct                            and retroflexion views.                           - No specimens collected. Recommendation:           - Patient has a contact number available for                            emergencies. The signs and symptoms of potential                            delayed complications were discussed with the                            patient. Return to normal activities tomorrow.                            Written discharge instructions were provided to the                             patient.                           - Resume previous diet.                           - Continue present medications.                           - Repeat colonoscopy in 10 years for screening                            purposes. Iva Booparl E [name], MD 01/02/2019 9:46:45 AM This report has been signed electronically.

## 2019-01-04 ENCOUNTER — Telehealth: Payer: Self-pay | Admitting: *Deleted

## 2019-01-04 NOTE — Telephone Encounter (Signed)
  Follow up Call-  Call back number 01/02/2019  Post procedure Call Back phone  # [phone]  Permission to leave phone message Yes  Some recent data might be hidden     Patient questions:  Do you have a fever, pain , or abdominal swelling? No. Pain Score  0 *  Have you tolerated food without any problems? Yes.    Have you been able to return to your normal activities? Yes.    Do you have any questions about your discharge instructions: Diet   No. Medications  No. Follow up visit  No.  Do you have questions or concerns about your Care? No.  Actions: * If pain score is 4 or above: No action needed, pain <4.  1. Have you developed a fever since your procedure? no  2.   Have you had an respiratory symptoms (SOB or cough) since your procedure? no  3.   Have you tested positive for COVID 19 since your procedure no  4.   Have you had any family members/close contacts diagnosed with the COVID 19 since your procedure?  no   If yes to any of these questions please route to Joylene John, RN and Alphonsa Gin, Therapist, sports.

## 2019-01-19 ENCOUNTER — Telehealth: Payer: Self-pay | Admitting: Internal Medicine

## 2019-01-19 NOTE — Telephone Encounter (Signed)
Patient reports that she has a dull ache on her right side and "loud digestive noises" after she eats.  She reports discomfort started after the colonoscopy about 2 weeks ago.  She reports it does not stop her from doing anything, but she is aware it is there all the time.  She will come in and see Ellouise Newer, [place] on 01/22/19 2:30

## 2019-01-19 NOTE — Telephone Encounter (Signed)
Pt had a colonoscopy 01/02/19.  She reported that she is experiencing a dull ache in the [employer] and digestive noises that he has not had before.

## 2019-01-22 ENCOUNTER — Encounter: Payer: Self-pay | Admitting: Physician Assistant

## 2019-01-22 ENCOUNTER — Other Ambulatory Visit: Payer: Self-pay

## 2019-01-22 ENCOUNTER — Other Ambulatory Visit (INDEPENDENT_AMBULATORY_CARE_PROVIDER_SITE_OTHER): Payer: 59

## 2019-01-22 ENCOUNTER — Ambulatory Visit (INDEPENDENT_AMBULATORY_CARE_PROVIDER_SITE_OTHER): Payer: 59 | Admitting: Physician Assistant

## 2019-01-22 VITALS — BP 114/76 | HR 96 | Temp 97.4°F | Ht 64.0 in | Wt 189.0 lb

## 2019-01-22 DIAGNOSIS — R141 Gas pain: Secondary | ICD-10-CM

## 2019-01-22 DIAGNOSIS — R109 Unspecified abdominal pain: Secondary | ICD-10-CM

## 2019-01-22 DIAGNOSIS — E876 Hypokalemia: Secondary | ICD-10-CM

## 2019-01-22 LAB — COMPREHENSIVE METABOLIC PANEL
ALT: 10 U/L (ref 0–35)
AST: 12 U/L (ref 0–37)
Albumin: 4 g/dL (ref 3.5–5.2)
Alkaline Phosphatase: 107 U/L (ref 39–117)
BUN: 19 mg/dL (ref 6–23)
CO2: 29 mEq/L (ref 19–32)
Calcium: 9.8 mg/dL (ref 8.4–10.5)
Chloride: 100 mEq/L (ref 96–112)
Creatinine, Ser: 0.55 mg/dL (ref 0.40–1.20)
GFR: 140.97 mL/min (ref >60.00)
Glucose, Bld: 99 mg/dL (ref 70–99)
Potassium: 3.1 mEq/L — ABNORMAL LOW (ref 3.5–5.1)
Sodium: 139 mEq/L (ref 135–145)
Total Bilirubin: 0.3 mg/dL (ref 0.2–1.2)
Total Protein: 8.1 g/dL (ref 6.0–8.3)

## 2019-01-22 LAB — CBC WITH DIFFERENTIAL/PLATELET
Basophils Absolute: 0.1 10*3/uL (ref 0.0–0.1)
Basophils Relative: 0.9 % (ref 0.0–3.0)
Eosinophils Absolute: 0.1 10*3/uL (ref 0.0–0.7)
Eosinophils Relative: 0.7 % (ref 0.0–5.0)
HCT: 36.5 % (ref 36.0–46.0)
Hemoglobin: 12.3 g/dL (ref 12.0–15.0)
Lymphocytes Relative: 32.3 % (ref 12.0–46.0)
Lymphs Abs: 3.3 10*3/uL (ref 0.7–4.0)
MCHC: 33.8 g/dL (ref 30.0–36.0)
MCV: 86.7 fl (ref 78.0–100.0)
Monocytes Absolute: 0.4 10*3/uL (ref 0.1–1.0)
Monocytes Relative: 4.3 % (ref 3.0–12.0)
Neutro Abs: 6.4 10*3/uL (ref 1.4–7.7)
Neutrophils Relative %: 61.8 % (ref 43.0–77.0)
Platelets: 361 10*3/uL (ref 150.0–400.0)
RBC: 4.21 Mil/uL (ref 3.87–5.11)
RDW: 14.3 % (ref 11.5–15.5)
WBC: 10.4 10*3/uL (ref 4.0–10.5)

## 2019-01-22 LAB — LIPASE: Lipase: 38 U/L (ref 11.0–59.0)

## 2019-01-22 MED ORDER — POTASSIUM CHLORIDE ER 10 MEQ PO TBCR: 40.0000 meq | EXTENDED_RELEASE_TABLET | Freq: Once | ORAL | 0 refills | Status: DC

## 2019-01-22 NOTE — Progress Notes (Signed)
Chief Complaint: Right-sided abdominal discomfort  HPI:    Tanya Richard is a 53 year old African-American female with past medical history as listed below including anxiety, known to Dr. Leone PayorGessner, who was referred to me by Wynn BankerKoberlein, Junell C, MD for a complaint of right-sided abdominal discomfort.      01/02/2019 colonoscopy for screening.  This found diverticulosis in the descending, sigmoid and ascending colon.  Repeat recommended in 10 years.    01/19/2019 patient: Reports she had a colonoscopy and is experiencing a dull ache in the lower right quadrant with digestive noises that she had not heard before.    Today, it should be noted that the patient tells me she is a "worry wart".  Explains that for the past 10 days she has been hearing increased noises in her abdomen and occasionally has a "discomfort not a pain" on the right side of her abdomen, this can occur 30 minutes to an hour after eating.  This can last for 30 to 60 minutes at a time and seems to come and go.  She has also noticed an increase in gas.  Also describes a "lump in my chest" feeling.  Tells me that she has increased her fiber supplementation per recommendations from Dr. Leone PayorGessner and in fact is taking Metamucil and Benefiber in excess ever since time of her procedures.    Along with this patient tells me that she has a pain in between her shoulder blades and sometimes has to use a heating pad.  At times this pain can radiate all down her back.  This has been occurring for longer, even before time of her colonoscopy, in fact she discussed it with her PCP and there were plans for a back x-ray at some point.  She is on Pepcid 20 mg nightly for some reflux.    Denies fever, chills, weight loss, change in bowel habits, nausea, vomiting, heartburn, reflux or symptoms that awaken her from sleep.  Past Medical History:  Diagnosis Date  . Allergy   . Anxiety    COVID related anxiety - situational  . ELEVATED BP READING WITHOUT DX  HYPERTENSION 10/14/2009  . GERD (gastroesophageal reflux disease)    Occ Pepcid AC depending on diet   . Hypertension   . Irregular heart beat    benign- no issues   . NEPHROLITHIASIS, HX OF 12/21/2007    Past Surgical History:  Procedure Laterality Date  . COLPOSCOPY    . GYNECOLOGIC CRYOSURGERY    . MOUTH SURGERY  1984    Current Outpatient Medications  Medication Sig Dispense Refill  . amLODipine (NORVASC) 5 MG tablet TAKE 1 TABLET (5 MG TOTAL) BY MOUTH DAILY. 90 tablet 2  . aspirin 81 MG chewable tablet Chew by mouth daily.    . Calcium Carb-Cholecalciferol (CALCIUM 1000 + D) 1000-800 MG-UNIT TABS     . CALCIUM PO Take by mouth.    . Cholecalciferol (D3-1000 PO) Take by mouth.    . clonazePAM (KLONOPIN) 0.5 MG tablet Take 0.5-1 tablets (0.25-0.5 mg total) by mouth 2 (two) times daily as needed for anxiety. 20 tablet 1  . Cyanocobalamin (B-12 PO) Take 500 mcg by mouth.    . famotidine (PEPCID) 10 MG tablet Take 10 mg by mouth 2 (two) times daily.    . hydrochlorothiazide (HYDRODIURIL) 25 MG tablet TAKE 1 TABLET BY MOUTH EVERY DAY 90 tablet 0  . JUNEL FE 1/20 1-20 MG-MCG tablet Take 1 tablet by mouth daily.    Marland Kitchen. KLOR-CON  M10 10 MEQ tablet TAKE 2 TABLETS BY MOUTH TWICE A DAY X 2 DAYS, THEN TAKE 1 TABLET DAILY    . loratadine (CLARITIN) 10 MG tablet Take 10 mg by mouth daily as needed.     . Multiple Vitamin (MULTIVITAMIN) capsule Take 1 capsule by mouth daily.      . naproxen sodium (ANAPROX) 550 MG tablet Take 1 tablet (550 mg total) by mouth 2 (two) times daily with a meal. (Patient not taking: Reported on 12/19/2018) 60 tablet 0  . Omega-3 Fatty Acids (FISH OIL PO) Take by mouth.    . traZODone (DESYREL) 50 MG tablet TAKE 0.5-1 TABLETS (25-50 MG TOTAL) BY MOUTH AT BEDTIME AS NEEDED FOR SLEEP. (Patient not taking: Reported on 12/19/2018) 90 tablet 1  . vitamin C (ASCORBIC ACID) 500 MG tablet Take 500 mg by mouth daily.     No current facility-administered medications for this  visit.     Allergies as of 01/22/2019 - Review Complete 01/02/2019  Allergen Reaction Noted  . Lisinopril Swelling 12/25/2014    Family History  Problem Relation Age of Onset  . Diabetes Mother   . Other Father        DVT  . Hypertension Father   . Pulmonary embolism Father        x2  . Diabetes Maternal Aunt   . Hypertension Maternal Grandmother   . Cancer Maternal Grandmother        MGGM-Uterine cancer  . Arthritis Neg Hx        family hx  . Colon cancer Neg Hx   . Colon polyps Neg Hx   . Esophageal cancer Neg Hx   . Rectal cancer Neg Hx   . Stomach cancer Neg Hx     Social History   Socioeconomic History  . Marital status: Single    Spouse name: Not on file  . Number of children: Not on file  . Years of education: Not on file  . Highest education level: Not on file  Occupational History    Employer: [employer]  Social Needs  . Financial resource strain: Not on file  . Food insecurity    Worry: Not on file    Inability: Not on file  . Transportation needs    Medical: Not on file    Non-medical: Not on file  Tobacco Use  . Smoking status: Never Smoker  . Smokeless tobacco: Never Used  Substance and Sexual Activity  . Alcohol use: Not Currently    Alcohol/week: 0.0 standard drinks  . Drug use: Never  . Sexual activity: Not Currently    Birth control/protection: Pill    Comment: INTERCOURSE AGE 12, SEXUAL PARTNERS LESS THAN 5  Lifestyle  . Physical activity    Days per week: Not on file    Minutes per session: Not on file  . Stress: Not on file  Relationships  . Social Herbalist on phone: Not on file    Gets together: Not on file    Attends religious service: Not on file    Active member of club or organization: Not on file    Attends meetings of clubs or organizations: Not on file    Relationship status: Not on file  . Intimate partner violence    Fear of current or ex partner: Not on file    Emotionally abused: Not on file     Physically abused: Not on file    Forced sexual activity: Not on  file  Other Topics Concern  . Not on file  Social History Narrative  . Not on file    Review of Systems:    Constitutional: No weight loss, fever or chills Cardiovascular: No chest pain   Respiratory: No SOB  Gastrointestinal: See HPI and otherwise negative   Physical Exam:  Vital signs: BP 114/76   Pulse 96   Temp (!) 97.4 F (36.3 C)   Ht 5\' 4"  (1.626 m)   Wt 189 lb (85.7 kg)   LMP 05/22/2018   BMI 32.44 kg/m   Constitutional:   Pleasant AA female appears to be in NAD, Well developed, Well nourished, alert and cooperative Respiratory: Respirations even and unlabored. Lungs clear to auscultation bilaterally.   No wheezes, crackles, or rhonchi.  Cardiovascular: Normal S1, S2. No MRG. Regular rate and rhythm. No peripheral edema, cyanosis or pallor.  Gastrointestinal:  Soft, nondistended, nontender. No rebound or guarding. Normal bowel sounds. No appreciable masses or hepatomegaly. Psychiatric: Demonstrates good judgement and reason without abnormal affect or behaviors.  No recent labs or imaging.  Assessment: 1.  Right sided abdominal discomfort: Off-and-on, not a pain just a discomfort, notices 30 minutes to an hour after eating, has had excessive fiber supplementation; consider relation to gas most likely 2.  Gas  Plan: 1.  Discussed with patient that sometimes after having colonoscopy your bacterial flora can be thrown off balance.  Recommend that she start a probiotic.  She tells me she is already taking something over-the-counter.  Discussed switching to Align probiotic once daily. 2.  Discussed fiber with patient.  She is likely taking excess which can lead to all of these feelings of discomfort bloating and excess gas.  Explained that she should be getting between 25 to 35 g/day through her supplement and her diet.  Provided her with a high-fiber diet handout to show her some of the fiber content of foods  that she is eating.  Recommend that she keep track of fiber intake in her food for the next couple of days and then supplement what she is missing. 3.  Ordered CBC, CMP and lipase to reassure the patient. 4.  Recommend she continue to follow with her PCP regarding back pain. 5.  Patient to follow in clinic as needed with Dr. 05/24/2018 or myself in the near future.  Leone Payor, PA-C [facility] 01/22/2019, 2:13 PM  Cc: 01/24/2019, MD

## 2019-01-22 NOTE — Patient Instructions (Addendum)
If you are age 51 or older, your body mass index should be between 23-30. Your Body mass index is 32.44 kg/m. If this is out of the aforementioned range listed, please consider follow up with your Primary Care Provider.  If you are age 19 or younger, your body mass index should be between 19-25. Your Body mass index is 32.44 kg/m. If this is out of the aformentioned range listed, please consider follow up with your Primary Care Provider.   Your provider has requested that you go to the basement level for lab work before leaving today. Press "B" on the elevator. The lab is located at the first door on the left as you exit the elevator.    Please purchase the following medications over the counter and take as directed:   1. Start Align probiotic daily.   High-Fiber Diet Fiber, also called dietary fiber, is a type of carbohydrate that is found in fruits, vegetables, whole grains, and beans. A high-fiber diet can have many health benefits. Your health care provider may recommend a high-fiber diet to help:  Prevent constipation. Fiber can make your bowel movements more regular.  Lower your cholesterol.  Relieve the following conditions: ? Swelling of veins in the anus (hemorrhoids). ? Swelling and irritation (inflammation) of specific areas of the digestive tract (uncomplicated diverticulosis). ? A problem of the large intestine (colon) that sometimes causes pain and diarrhea (irritable bowel syndrome, IBS).  Prevent overeating as part of a weight-loss plan.  Prevent heart disease, type 2 diabetes, and certain cancers. What is my plan? The recommended daily fiber intake in grams (g) includes:  38 g for men age 32 or younger.  30 g for men over age 32.  79 g for women age 26 or younger.  21 g for women over age 8. You can get the recommended daily intake of dietary fiber by:  Eating a variety of fruits, vegetables, grains, and beans.  Taking a fiber supplement, if it is not  possible to get enough fiber through your diet. What do I need to know about a high-fiber diet?  It is better to get fiber through food sources rather than from fiber supplements. There is not a lot of research about how effective supplements are.  Always check the fiber content on the nutrition facts label of any prepackaged food. Look for foods that contain 5 g of fiber or more per serving.  Talk with a diet and nutrition specialist (dietitian) if you have questions about specific foods that are recommended or not recommended for your medical condition, especially if those foods are not listed below.  Gradually increase how much fiber you consume. If you increase your intake of dietary fiber too quickly, you may have bloating, cramping, or gas.  Drink plenty of water. Water helps you to digest fiber. What are tips for following this plan?  Eat a wide variety of high-fiber foods.  Make sure that half of the grains that you eat each day are whole grains.  Eat breads and cereals that are made with whole-grain flour instead of refined flour or white flour.  Eat brown rice, bulgur wheat, or millet instead of white rice.  Start the day with a breakfast that is high in fiber, such as a cereal that contains 5 g of fiber or more per serving.  Use beans in place of meat in soups, salads, and pasta dishes.  Eat high-fiber snacks, such as berries, raw vegetables, nuts, and popcorn.  Choose  whole fruits and vegetables instead of processed forms like juice or sauce. What foods can I eat?  Fruits Berries. Pears. Apples. Oranges. Avocado. Prunes and raisins. Dried figs. Vegetables Sweet potatoes. Spinach. Kale. Artichokes. Cabbage. Broccoli. Cauliflower. Green peas. Carrots. Squash. Grains Whole-grain breads. Multigrain cereal. Oats and oatmeal. Brown rice. Barley. Bulgur wheat. Millet. Quinoa. Bran muffins. Popcorn. Rye wafer crackers. Meats and other proteins Navy, kidney, and pinto beans.  Soybeans. Split peas. Lentils. Nuts and seeds. Dairy Fiber-fortified yogurt. Beverages Fiber-fortified soy milk. Fiber-fortified orange juice. Other foods Fiber bars. The items listed above may not be a complete list of recommended foods and beverages. Contact a dietitian for more options. What foods are not recommended? Fruits Fruit juice. Cooked, strained fruit. Vegetables Fried potatoes. Canned vegetables. Well-cooked vegetables. Grains White bread. Pasta made with refined flour. White rice. Meats and other proteins Fatty cuts of meat. Fried chicken or fried fish. Dairy Milk. Yogurt. Cream cheese. Sour cream. Fats and oils Butters. Beverages Soft drinks. Other foods Cakes and pastries. The items listed above may not be a complete list of foods and beverages to avoid. Contact a dietitian for more information. Summary  Fiber is a type of carbohydrate. It is found in fruits, vegetables, whole grains, and beans.  There are many health benefits of eating a high-fiber diet, such as preventing constipation, lowering blood cholesterol, helping with weight loss, and reducing your risk of heart disease, diabetes, and certain cancers.  Gradually increase your intake of fiber. Increasing too fast can result in cramping, bloating, and gas. Drink plenty of water while you increase your fiber.  The best sources of fiber include whole fruits and vegetables, whole grains, nuts, seeds, and beans. This information is not intended to replace advice given to you by your health care provider. Make sure you discuss any questions you have with your health care provider. Document Released: 02/22/2005 Document Revised: 12/27/2016 Document Reviewed: 12/27/2016 Elsevier Patient Education  2020 [employer].

## 2019-02-07 ENCOUNTER — Other Ambulatory Visit: Payer: Self-pay | Admitting: Family Medicine

## 2019-02-07 NOTE — Telephone Encounter (Signed)
Last OV 09/11/18 Last refill 10/23/18 #90/0 Next OV not scheduled

## 2019-02-09 ENCOUNTER — Other Ambulatory Visit: Payer: Self-pay | Admitting: Women's Health

## 2019-02-09 DIAGNOSIS — Z1231 Encounter for screening mammogram for malignant neoplasm of breast: Secondary | ICD-10-CM

## 2019-02-12 ENCOUNTER — Other Ambulatory Visit: Payer: Self-pay

## 2019-02-12 ENCOUNTER — Encounter: Payer: Self-pay | Admitting: Family Medicine

## 2019-02-12 NOTE — Telephone Encounter (Signed)
Can you schedule xray time please?

## 2019-02-13 ENCOUNTER — Ambulatory Visit (INDEPENDENT_AMBULATORY_CARE_PROVIDER_SITE_OTHER): Payer: 59 | Admitting: Women's Health

## 2019-02-13 ENCOUNTER — Encounter: Payer: Self-pay | Admitting: Women's Health

## 2019-02-13 ENCOUNTER — Other Ambulatory Visit (INDEPENDENT_AMBULATORY_CARE_PROVIDER_SITE_OTHER): Payer: 59

## 2019-02-13 VITALS — BP 122/80

## 2019-02-13 DIAGNOSIS — N644 Mastodynia: Secondary | ICD-10-CM | POA: Diagnosis not present

## 2019-02-13 DIAGNOSIS — E876 Hypokalemia: Secondary | ICD-10-CM | POA: Diagnosis not present

## 2019-02-13 LAB — BASIC METABOLIC PANEL
BUN: 14 mg/dL (ref 6–23)
CO2: 30 mEq/L (ref 19–32)
Calcium: 9.1 mg/dL (ref 8.4–10.5)
Chloride: 104 mEq/L (ref 96–112)
Creatinine, Ser: 0.49 mg/dL (ref 0.40–1.20)
GFR: 161.04 mL/min (ref >60.00)
Glucose, Bld: 87 mg/dL (ref 70–99)
Potassium: 3.3 mEq/L — ABNORMAL LOW (ref 3.5–5.1)
Sodium: 142 mEq/L (ref 135–145)

## 2019-02-13 NOTE — Progress Notes (Signed)
53 year old S WF G0 presents with complaint of left breast tenderness especially on the outer aspect with occasional sharp shooting type pain for the past month.  Due for annual screening mammogram, normal mammogram history.  No family history of breast cancer.  Long-term history of upper back and neck pain due to pendulous breasts.  Works from home, no change in routine, exercise, denies heavy lifting or injury.  Medical problems include hypertension.  Approximately 11 months since last cycle, not sexually active.  Struggling with anxiety and grief, a niece who she helped raise died suddenly from pulmonary embolism this past year.  In counseling.  Exam: Appears well.  Breast pendulous, examined sitting and lying position without visible dimpling, erythema, left breast several keloids 1 large 4 cm area on left outer aspect from cysts.  No palpable nodules masses or nipple discharge.  Minimal tenderness with exam.  Left breast mastodynia with occasional shooting pain  Plan: Diagnostic left breast mammogram.  Vitamin E twice daily, continue to wear good supportive bra.  Reassurance given.  Encouraged to continue counseling, condolences given.

## 2019-02-14 ENCOUNTER — Telehealth: Payer: Self-pay | Admitting: *Deleted

## 2019-02-14 DIAGNOSIS — N644 Mastodynia: Secondary | ICD-10-CM

## 2019-02-14 NOTE — Telephone Encounter (Signed)
-----   Message from Huel Cote, NP sent at 02/13/2019  8:19 AM EST ----- Needs left diagnostic breast mammogram, breast center, sharp shooting pain, outer aspect tender, postmenopausal  no family hx.  Pendulous breasts, cant go on the 10th, off work 14 and 21st would be good

## 2019-02-14 NOTE — Telephone Encounter (Signed)
Patient scheduled on 02/26/19 @ 1:00pm, patient informed.

## 2019-02-15 ENCOUNTER — Other Ambulatory Visit: Payer: Self-pay

## 2019-02-16 ENCOUNTER — Ambulatory Visit (INDEPENDENT_AMBULATORY_CARE_PROVIDER_SITE_OTHER): Payer: 59

## 2019-02-16 ENCOUNTER — Other Ambulatory Visit: Payer: 59

## 2019-02-16 DIAGNOSIS — G8929 Other chronic pain: Secondary | ICD-10-CM | POA: Diagnosis not present

## 2019-02-16 DIAGNOSIS — M545 Low back pain: Secondary | ICD-10-CM

## 2019-02-19 ENCOUNTER — Telehealth: Payer: Self-pay | Admitting: *Deleted

## 2019-02-19 NOTE — Telephone Encounter (Signed)
Noted. I have already sent result note on this.

## 2019-02-19 NOTE — Telephone Encounter (Signed)
Gabriel from Radiology called to give verbal results of lumbar spine results: Compression fracture suggested at T-11 seen at upper margin of film, calsification density of right of spine could represent a urinary tract calculus.

## 2019-02-21 ENCOUNTER — Encounter: Payer: Self-pay | Admitting: Family Medicine

## 2019-02-21 ENCOUNTER — Other Ambulatory Visit: Payer: Self-pay | Admitting: Family Medicine

## 2019-02-21 DIAGNOSIS — M545 Low back pain, unspecified: Secondary | ICD-10-CM

## 2019-02-21 DIAGNOSIS — S22080A Wedge compression fracture of T11-T12 vertebra, initial encounter for closed fracture: Secondary | ICD-10-CM

## 2019-02-26 ENCOUNTER — Other Ambulatory Visit: Payer: Self-pay

## 2019-02-26 ENCOUNTER — Ambulatory Visit: Payer: 59

## 2019-02-26 ENCOUNTER — Ambulatory Visit
Admission: RE | Admit: 2019-02-26 | Discharge: 2019-02-26 | Disposition: A | Discharge status: Home / Self Care | Payer: 59 | Source: Ambulatory Visit | Attending: Women's Health | Admitting: Women's Health

## 2019-02-26 DIAGNOSIS — N644 Mastodynia: Secondary | ICD-10-CM

## 2019-03-06 ENCOUNTER — Telehealth: Payer: Self-pay

## 2019-03-06 ENCOUNTER — Ambulatory Visit (INDEPENDENT_AMBULATORY_CARE_PROVIDER_SITE_OTHER): Payer: 59 | Admitting: Orthopaedic Surgery

## 2019-03-06 ENCOUNTER — Encounter: Payer: Self-pay | Admitting: Orthopaedic Surgery

## 2019-03-06 ENCOUNTER — Other Ambulatory Visit: Payer: Self-pay

## 2019-03-06 DIAGNOSIS — M546 Pain in thoracic spine: Secondary | ICD-10-CM | POA: Diagnosis not present

## 2019-03-06 NOTE — Telephone Encounter (Signed)
I called the pt to ask where she would like her records to go within the [facility] pt said she doe not know why they are requesting her notes, and she has an appt with Dr Lorin Mercy on 12/219. I called [facility] spoke with Ebony Hail said the referral was placed in proficient she would cancel the referral.

## 2019-03-06 NOTE — Progress Notes (Signed)
Office Visit Note   Patient: Tanya Richard           Date of Birth: [birth year]           MRN: [id number] Visit Date: 03/06/2019              Requested by: Caren Macadam, MD [place],  [place] [address] PCP: Caren Macadam, MD   Assessment & Plan: Visit Diagnoses:  1. Pain in thoracic spine     Plan: Recent x-rays reviewed with a slight compression at T11 suggesting an old injury.  No evidence of coccyx fracture.  She has been working on weight loss and states she has lost almost 50 pounds.  Continue walking program.  If pain gets worse she will call we can make a therapy referral for her.  Currently her symptoms are not severe enough to consider this she describes it more as a discomfort rather than actual pain.  Return if she has increasing symptoms.  Follow-Up Instructions: Return if symptoms worsen or fail to improve.   Orders:  No orders of the defined types were placed in this encounter.  No orders of the defined types were placed in this encounter.     Procedures: No procedures performed   Clinical Data: No additional findings.   Subjective: Chief Complaint  Patient presents with   Middle Back - Pain   Lower Back - Pain    HPI 53 year old female here with problems with pain in her coccyx as well as some pain of thoracolumbar junction present for many months.  It does not stop her from doing anything no fever chills no numbness or tingling in her feet.  She is noticed some aching at the bra line some little bit below.  Past history of kidney stones but this pain is different.  She has had extreme tenderness of the tailbone.  She fell on some stairs breaking her toe 7 to 11 years ago.  She does not recall anything else that could have been a problem.  X-rays obtained showed some evidence of suggestive of old T11 compression fracture of less than 20%.  Patient is on calcium and vitamin D supplements never had a bone density test no other  fractures other than the toe with a fall.  Review of Systems you view of system positive for kidney stones depression, hypertension otherwise negative as pertains HPI.   Objective: Vital Signs: BP 125/82    Pulse 85    Ht 5\' 4"  (1.626 m)    Wt 191 lb (86.6 kg)    BMI 32.79 kg/m   Physical Exam Constitutional:      Appearance: She is well-developed.  HENT:     Head: Normocephalic.     Right Ear: External ear normal.     Left Ear: External ear normal.  Eyes:     Pupils: Pupils are equal, round, and reactive to light.  Neck:     Thyroid: No thyromegaly.     Trachea: No tracheal deviation.  Cardiovascular:     Rate and Rhythm: Normal rate.  Pulmonary:     Effort: Pulmonary effort is normal.  Abdominal:     Palpations: Abdomen is soft.  Skin:    General: Skin is warm and dry.  Neurological:     Mental Status: She is alert and oriented to person, place, and time.  Psychiatric:        Behavior: Behavior normal.     Ortho Exam patient  has some tenderness at the sacrococcyx junction.  Mild sciatic notch tender on the right minimal trochanteric bursal tenderness negative logroll the hips knee and ankle jerk 1+ and symmetrical.  Anterior tib gastrocsoleus is strong she is able to heel and toe walk easily.  Mild tenderness at T11-T12 region with no paralumbar referral rather than the midline.  Specialty Comments:  No specialty comments available.  Imaging: No results found.   PMFS History: Patient Active Problem List   Diagnosis Date Noted   Pain in thoracic spine 03/06/2019   Obesity 08/10/2013   CIN I (cervical intraepithelial neoplasia I)    Essential hypertension 10/28/2009   RENAL COLIC 12/21/2007   NEPHROLITHIASIS, HX OF 12/21/2007   DEPRESSION 10/19/2006   Past Medical History:  Diagnosis Date   Allergy    Anxiety    COVID related anxiety - situational   ELEVATED BP READING WITHOUT DX HYPERTENSION 10/14/2009   GERD (gastroesophageal reflux disease)      Occ Pepcid AC depending on diet    Hypertension    Irregular heart beat    benign- no issues    NEPHROLITHIASIS, HX OF 12/21/2007    Family History  Problem Relation Age of Onset   Diabetes Mother    Other Father        DVT   Hypertension Father    Pulmonary embolism Father        x2   Diabetes Maternal Aunt    Hypertension Maternal Grandmother    Cancer Maternal Grandmother        MGGM-Uterine cancer   Arthritis Neg Hx        family hx   Colon cancer Neg Hx    Colon polyps Neg Hx    Esophageal cancer Neg Hx    Rectal cancer Neg Hx    Stomach cancer Neg Hx     Past Surgical History:  Procedure Laterality Date   COLPOSCOPY     GYNECOLOGIC CRYOSURGERY     MOUTH SURGERY  1984   Social History   Occupational History    Employer: [employer]  Tobacco Use   Smoking status: Never Smoker   Smokeless tobacco: Never Used  Substance and Sexual Activity   Alcohol use: Not Currently    Alcohol/week: 0.0 standard drinks   Drug use: Never   Sexual activity: Not Currently    Birth control/protection: Pill    Comment: INTERCOURSE AGE 54, SEXUAL PARTNERS LESS THAN 5

## 2019-03-06 NOTE — Telephone Encounter (Signed)
Copied from [place] [id number]. Topic: Referral - Question >> Mar 06, 2019  9:47 AM Rayann Heman wrote: Reason for CRM: Ebony Hail calling from [facility] states that she received a referral but did not receive OV notes and demographics. Please fax over to [phone]

## 2019-04-05 ENCOUNTER — Other Ambulatory Visit: Payer: Self-pay | Admitting: Family Medicine

## 2019-04-23 ENCOUNTER — Other Ambulatory Visit: Payer: Self-pay | Admitting: Family Medicine

## 2019-04-24 ENCOUNTER — Telehealth (INDEPENDENT_AMBULATORY_CARE_PROVIDER_SITE_OTHER): Payer: 59 | Admitting: Family Medicine

## 2019-04-24 ENCOUNTER — Encounter: Payer: Self-pay | Admitting: Family Medicine

## 2019-04-24 ENCOUNTER — Other Ambulatory Visit: Payer: Self-pay

## 2019-04-24 VITALS — Wt 194.8 lb

## 2019-04-24 DIAGNOSIS — R202 Paresthesia of skin: Secondary | ICD-10-CM

## 2019-04-24 DIAGNOSIS — R0981 Nasal congestion: Secondary | ICD-10-CM

## 2019-04-24 DIAGNOSIS — J309 Allergic rhinitis, unspecified: Secondary | ICD-10-CM | POA: Diagnosis not present

## 2019-04-24 NOTE — Progress Notes (Signed)
Virtual Visit via Video Note  I connected with Tanya Richard  on 04/24/19 at 10:20 AM EST by a video enabled telemedicine application and verified that I am speaking with the correct person using two identifiers.  Location patient: home Location provider:work or home office Persons participating in the virtual visit: patient, provider  I discussed the limitations of evaluation and management by telemedicine and the availability of in person appointments. The patient expressed understanding and agreed to proceed.   HPI:  Acute visit for head discomfort and other issues: -reports started in July 2020 during a sinus infection - pretty much daily since then -she has history of allergies - year round, takes claritin sporatically -feels like a pressure throbbing sensation (not pain)in the frontal region and behind the R ear, sometimes feels like ants crawling in the frontal scalp region, nasal congestion, itchy watery eyes, constant dripping of the nose, stopped up sinuses, feels mildly dizzy sometimes if turns head real fast -sinuses have been consistently bad since July and she never had this before -she is having a mold test done on her house, -negative COVID test yesterday, she checked to make sure though reports no exposures, lives alone and does not go out -no new pets or exposures that she know of -did have water leak in her home repair last year and having mold test done -she was wanting to see a neurologist, mother sees Dr. Terrace Arabia at [facility], requesting a referral -mother has MG and she worries about this -she has been very stressed this year as well -denies: fevers, sob, sneezing, cough, weight loss, facial drooping, weakness, numbness, vision changes, spech changes, memory loss -reports BP has been ok, 123/78 las check  - she was watching it  ROS: See pertinent positives and negatives per HPI.  Past Medical History:  Diagnosis Date  . Allergy   . Anxiety    COVID related anxiety  - situational  . ELEVATED BP READING WITHOUT DX HYPERTENSION 10/14/2009  . GERD (gastroesophageal reflux disease)    Occ Pepcid AC depending on diet   . Hypertension   . Irregular heart beat    benign- no issues   . NEPHROLITHIASIS, HX OF 12/21/2007    Past Surgical History:  Procedure Laterality Date  . COLPOSCOPY    . GYNECOLOGIC CRYOSURGERY    . MOUTH SURGERY  1984    Family History  Problem Relation Age of Onset  . Diabetes Mother   . Other Father        DVT  . Hypertension Father   . Pulmonary embolism Father        x2  . Diabetes Maternal Aunt   . Hypertension Maternal Grandmother   . Cancer Maternal Grandmother        MGGM-Uterine cancer  . Arthritis Neg Hx        family hx  . Colon cancer Neg Hx   . Colon polyps Neg Hx   . Esophageal cancer Neg Hx   . Rectal cancer Neg Hx   . Stomach cancer Neg Hx     SOCIAL HX: see hpi   Current Outpatient Medications:  .  amLODipine (NORVASC) 5 MG tablet, TAKE 1 TABLET BY MOUTH EVERY DAY, Disp: 90 tablet, Rfl: 0 .  Calcium Carb-Cholecalciferol (CALCIUM 1000 + D) 1000-800 MG-UNIT TABS, , Disp: , Rfl:  .  clonazePAM (KLONOPIN) 0.5 MG tablet, Take 0.5-1 tablets (0.25-0.5 mg total) by mouth 2 (two) times daily as needed for anxiety. (Patient taking differently: Take 0.25  mg by mouth at bedtime as needed for anxiety. ), Disp: 20 tablet, Rfl: 1 .  famotidine (PEPCID) 10 MG tablet, Take 10 mg by mouth daily. , Disp: , Rfl:  .  hydrochlorothiazide (HYDRODIURIL) 25 MG tablet, TAKE 1 TABLET BY MOUTH EVERY DAY, Disp: 90 tablet, Rfl: 0 .  KLOR-CON M10 10 MEQ tablet, TAKE 2 TABLETS BY MOUTH TWICE A DAY X 2 DAYS, THEN TAKE 1 TABLET DAILY, Disp: , Rfl:  .  loratadine (CLARITIN) 10 MG tablet, Take 10 mg by mouth daily as needed. , Disp: , Rfl:  .  Multiple Vitamin (MULTIVITAMIN) capsule, Take 1 capsule by mouth daily.  , Disp: , Rfl:  .  naproxen sodium (ANAPROX) 550 MG tablet, Take 1 tablet (550 mg total) by mouth 2 (two) times daily with a  meal. (Patient taking differently: Take 550 mg by mouth as needed. ), Disp: 60 tablet, Rfl: 0 .  Omega-3 Fatty Acids (FISH OIL PO), Take by mouth., Disp: , Rfl:  .  traZODone (DESYREL) 50 MG tablet, TAKE 0.5-1 TABLETS (25-50 MG TOTAL) BY MOUTH AT BEDTIME AS NEEDED FOR SLEEP., Disp: 90 tablet, Rfl: 1 .  vitamin C (ASCORBIC ACID) 500 MG tablet, Take 500 mg by mouth daily., Disp: , Rfl:  .  VITAMIN E PO, Take 400 Units by mouth daily., Disp: , Rfl:  .  potassium chloride (KLOR-CON) 10 MEQ tablet, Take 4 tablets (40 mEq total) by mouth once for 1 dose., Disp: 4 tablet, Rfl: 0  EXAM:  VITALS per patient if applicable:see hpi  GENERAL: alert, oriented, appears well and in no acute distress  HEENT: atraumatic, conjunttiva clear, no obvious abnormalities on inspection of external nose and ears  NECK: normal movements of the head and neck  LUNGS: on inspection no signs of respiratory distress, breathing rate appears normal, no obvious gross SOB, gasping or wheezing  CV: no obvious cyanosis  MS: moves all visible extremities without noticeable abnormality  PSYCH/NEURO: pleasant and cooperative, no obvious depression or anxiety, speech and thought processing grossly intact  ASSESSMENT AND PLAN:  Discussed the following assessment and plan:  Sinus congestion  Paresthesia  Allergic rhinitis, unspecified seasonality, unspecified trigger  -we discussed possible serious and likely etiologies, options for evaluation and workup, limitations of telemedicine visit vs in person visit, treatment, treatment risks and precautions. Pt prefers to treat via telemedicine empirically rather then risking or undertaking an in person visit at this moment. She is requesting a referral to neurology for the ongoing odd head sensations - do agrees she needs inperson evaluation for this and possible further studies - referral sent per her request to [facility], Dr. Krista Blue. Her sinus issues seem allergy related  and opted to try a few weeks of regular treatment with Claritin and flonase. Sent message to schedulers for follow up with PCP in 2-4 weeks to see if this helping. May need to see ENT/Allergist or other if persists. Patient agrees to seek prompt in person care if worsening, new symptoms arise, or if is not improving with treatment.   I discussed the assessment and treatment plan with the patient. The patient was provided an opportunity to ask questions and all were answered. The patient agreed with the plan and demonstrated an understanding of the instructions.   The patient was advised to call back or seek an in-person evaluation if the symptoms worsen or if the condition fails to improve as anticipated.   Lucretia Kern, DO

## 2019-05-16 ENCOUNTER — Telehealth: Payer: 59 | Admitting: Family Medicine

## 2019-05-21 ENCOUNTER — Other Ambulatory Visit: Payer: Self-pay | Admitting: Family Medicine

## 2019-05-24 ENCOUNTER — Ambulatory Visit: Payer: 59 | Admitting: Neurology

## 2019-06-01 ENCOUNTER — Ambulatory Visit: Payer: 59 | Admitting: Family Medicine

## 2019-06-11 ENCOUNTER — Encounter: Payer: Self-pay | Admitting: Neurology

## 2019-06-11 ENCOUNTER — Other Ambulatory Visit: Payer: Self-pay

## 2019-06-11 ENCOUNTER — Ambulatory Visit (INDEPENDENT_AMBULATORY_CARE_PROVIDER_SITE_OTHER): Payer: 59 | Admitting: Neurology

## 2019-06-11 VITALS — BP 125/81 | HR 68 | Temp 97.9°F | Ht 64.0 in | Wt 200.0 lb

## 2019-06-11 DIAGNOSIS — F5104 Psychophysiologic insomnia: Secondary | ICD-10-CM

## 2019-06-11 DIAGNOSIS — R519 Headache, unspecified: Secondary | ICD-10-CM | POA: Diagnosis not present

## 2019-06-11 MED ORDER — NORTRIPTYLINE HCL 25 MG PO CAPS: 50.0000 mg | ORAL_CAPSULE | Freq: Every day | ORAL | 11 refills | Status: DC

## 2019-06-11 NOTE — Progress Notes (Signed)
PATIENT: Tanya Richard DOB: 19-Mar-1965  Chief Complaint  Patient presents with  . Numbness    Reports intermittent throbbing sensations across the top of her head. She also has soreness behind her right ear. Symptoms started around April 2020.   Marland Kitchen PCP    Wynn Banker, MD     HISTORICAL  Tanya Richard is a 54 year old female, seen in request by her primary care physician Dr. Hassan Rowan, Jannette Spanner C for evaluation of intermittent head discomfort, initial evaluation was on June 11, 2019.  I have reviewed and summarized the referring note from the referring physician.  She had past medical history of hypertension, anxiety, chronic insomnia, but denies a history of migraine headache  Starting shortly after pandemic 2020, she began to noticed intermittent head discomfort, she denied it was a significant pain, usually started at right occipital region, pressure, and crawling sensation, sometimes pounding, also frequently involving vertex, bilateral frontal region, it can last 30 minutes, she denies light noise sensitivity, denies nausea, no visual loss, no dysarthria, dysphagia, no limb muscle weakness, she also has intermittent bilateral hands numbness, but relieved quickly by shaking her hands  She has above described symptoms about daily, over the past 1 year, there was no significant change.  She does has history of chronic anxiety, tends to worry that above symptoms might represent some underlying serious illness, has difficulty sleeping.  Laboratory evaluation in 2020, normal CMP, CBC, B12, A1c 5.9, ferritin, TSH, lipid panel,  REVIEW OF SYSTEMS: Full 14 system review of systems performed and notable only for as above All other review of systems were negative.  ALLERGIES: Allergies  Allergen Reactions  . Lisinopril Swelling    Angioedema of lip see ed visit 101 6    HOME MEDICATIONS: Current Outpatient Medications  Medication Sig Dispense Refill  . amLODipine (NORVASC) 5  MG tablet TAKE 1 TABLET BY MOUTH EVERY DAY 90 tablet 0  . Calcium Carb-Cholecalciferol (CALCIUM 1000 + D) 1000-800 MG-UNIT TABS     . clonazePAM (KLONOPIN) 0.5 MG tablet Take 0.5-1 tablets (0.25-0.5 mg total) by mouth 2 (two) times daily as needed for anxiety. (Patient taking differently: Take 0.25 mg by mouth at bedtime as needed for anxiety. ) 20 tablet 1  . famotidine (PEPCID) 10 MG tablet Take 10 mg by mouth daily.     . fluticasone (FLONASE) 50 MCG/ACT nasal spray Place 1 spray into both nostrils daily.    . hydrochlorothiazide (HYDRODIURIL) 25 MG tablet TAKE 1 TABLET BY MOUTH EVERY DAY 90 tablet 0  . KLOR-CON M10 10 MEQ tablet TAKE 2 TABLETS BY MOUTH TWICE A DAY X 2 DAYS, THEN TAKE 1 TABLET DAILY 100 tablet 1  . loratadine (CLARITIN) 10 MG tablet Take 10 mg by mouth daily as needed.     . Multiple Vitamin (MULTIVITAMIN) capsule Take 1 capsule by mouth daily.      . naproxen sodium (ANAPROX) 550 MG tablet Take 1 tablet (550 mg total) by mouth 2 (two) times daily with a meal. (Patient taking differently: Take 550 mg by mouth as needed. ) 60 tablet 0  . Omega-3 Fatty Acids (FISH OIL PO) Take by mouth.    . traZODone (DESYREL) 50 MG tablet TAKE 0.5-1 TABLETS (25-50 MG TOTAL) BY MOUTH AT BEDTIME AS NEEDED FOR SLEEP. 90 tablet 1  . vitamin C (ASCORBIC ACID) 500 MG tablet Take 500 mg by mouth daily.    Marland Kitchen VITAMIN E PO Take 400 Units by mouth daily.    Marland Kitchen  potassium chloride (KLOR-CON) 10 MEQ tablet Take 4 tablets (40 mEq total) by mouth once for 1 dose. 4 tablet 0   No current facility-administered medications for this visit.    PAST MEDICAL HISTORY: Past Medical History:  Diagnosis Date  . Allergy   . Anxiety    COVID related anxiety - situational  . ELEVATED BP READING WITHOUT DX HYPERTENSION 10/14/2009  . GERD (gastroesophageal reflux disease)    Occ Pepcid AC depending on diet   . Hypertension   . Irregular heart beat    benign- no issues   . NEPHROLITHIASIS, HX OF 12/21/2007    PAST  SURGICAL HISTORY: Past Surgical History:  Procedure Laterality Date  . COLPOSCOPY    . GYNECOLOGIC CRYOSURGERY    . MOUTH SURGERY  1984    FAMILY HISTORY: Family History  Problem Relation Age of Onset  . Diabetes Mother   . Myasthenia gravis Mother   . Other Father        DVT  . Hypertension Father   . Pulmonary embolism Father        x2  . Diabetes Maternal Aunt   . Hypertension Maternal Grandmother   . Cancer Maternal Grandmother        MGGM-Uterine cancer  . Arthritis Neg Hx        family hx  . Colon cancer Neg Hx   . Colon polyps Neg Hx   . Esophageal cancer Neg Hx   . Rectal cancer Neg Hx   . Stomach cancer Neg Hx     SOCIAL HISTORY: Social History   Socioeconomic History  . Marital status: Single    Spouse name: Not on file  . Number of children: 0  . Years of education: three years college  . Highest education level: Not on file  Occupational History    Employer: [employer]  Tobacco Use  . Smoking status: Never Smoker  . Smokeless tobacco: Never Used  Substance and Sexual Activity  . Alcohol use: Not Currently    Alcohol/week: 0.0 standard drinks  . Drug use: Never  . Sexual activity: Not Currently    Birth control/protection: Pill    Comment: INTERCOURSE AGE 78, SEXUAL PARTNERS LESS THAN 5  Other Topics Concern  . Not on file  Social History Narrative   Lives at home alone.   Caffeine use: 4 cups per day.   Right-handed.   Social Determinants of Health   Financial Resource Strain:   . Difficulty of Paying Living Expenses:   Food Insecurity:   . Worried About Programme researcher, broadcasting/film/video in the Last Year:   . Barista in the Last Year:   Transportation Needs:   . Freight forwarder (Medical):   Marland Kitchen Lack of Transportation (Non-Medical):   Physical Activity:   . Days of Exercise per Week:   . Minutes of Exercise per Session:   Stress:   . Feeling of Stress :   Social Connections:   . Frequency of Communication with Friends and Family:    . Frequency of Social Gatherings with Friends and Family:   . Attends Religious Services:   . Active Member of Clubs or Organizations:   . Attends Banker Meetings:   Marland Kitchen Marital Status:   Intimate Partner Violence:   . Fear of Current or Ex-Partner:   . Emotionally Abused:   Marland Kitchen Physically Abused:   . Sexually Abused:      PHYSICAL EXAM   Vitals:  06/11/19 0725  BP: 125/81  Pulse: 68  Temp: 97.9 F (36.6 C)  Weight: 200 lb (90.7 kg)  Height: 5\' 4"  (1.626 m)    Not recorded      Body mass index is 34.33 kg/m.  PHYSICAL EXAMNIATION:  Gen: NAD, conversant, well nourised, well groomed                     Cardiovascular: Regular rate rhythm, no peripheral edema, warm, nontender. Eyes: Conjunctivae clear without exudates or hemorrhage Neck: Supple, no carotid bruits. Pulmonary: Clear to auscultation bilaterally   NEUROLOGICAL EXAM:  MENTAL STATUS: Speech:    Speech is normal; fluent and spontaneous with normal comprehension.  Cognition:     Orientation to time, place and person     Normal recent and remote memory     Normal Attention span and concentration     Normal Language, naming, repeating,spontaneous speech     Fund of knowledge   CRANIAL NERVES: CN II: Visual fields are full to confrontation. Pupils are round equal and briskly reactive to light. CN III, IV, VI: extraocular movement are normal. No ptosis. CN V: Facial sensation is intact to light touch CN VII: Face is symmetric with normal eye closure  CN VIII: Hearing is normal to causal conversation. CN IX, X: Phonation is normal. CN XI: Head turning and shoulder shrug are intact  MOTOR: There is no pronator drift of out-stretched arms. Muscle bulk and tone are normal. Muscle strength is normal.  REFLEXES: Reflexes are 2+ and symmetric at the biceps, triceps, knees, and ankles. Plantar responses are flexor.  SENSORY: Intact to light touch, pinprick and vibratory sensation are intact in  fingers and toes.  COORDINATION: There is no trunk or limb dysmetria noted.  GAIT/STANCE: Posture is normal. Gait is steady with normal steps, base, arm swing, and turning. Heel and toe walking are normal. Tandem gait is normal.  Romberg is absent.   DIAGNOSTIC DATA (LABS, IMAGING, TESTING) - I reviewed patient records, labs, notes, testing and imaging myself where available.   ASSESSMENT AND PLAN  Aireonna Bauer is a 54 y.o. female   Intermittent right-sided headaches, Chronic insomnia  Has some migraine features, can also be anxiety related symptoms.  She denies focal symptoms, essentially normal neurological examinations,  After discussed with patient, we decided to hold off imaging study at this point,  Will try low-dose nortriptyline, 25 mg, titrating to 50 mg every night   Marcial Pacas, M.D. Ph.D.  [facility] [address] [place], [place] [address] Ph: [phone] Fax: [phone]  CC: Caren Macadam, MD

## 2019-07-03 ENCOUNTER — Other Ambulatory Visit: Payer: Self-pay | Admitting: Neurology

## 2019-07-15 ENCOUNTER — Other Ambulatory Visit: Payer: Self-pay | Admitting: Family Medicine

## 2019-07-30 ENCOUNTER — Other Ambulatory Visit: Payer: Self-pay | Admitting: Women's Health

## 2019-07-30 DIAGNOSIS — Z1231 Encounter for screening mammogram for malignant neoplasm of breast: Secondary | ICD-10-CM

## 2019-08-15 ENCOUNTER — Ambulatory Visit
Admission: RE | Admit: 2019-08-15 | Discharge: 2019-08-15 | Disposition: A | Discharge status: Home / Self Care | Payer: 59 | Source: Ambulatory Visit | Attending: *Deleted | Admitting: *Deleted

## 2019-08-15 ENCOUNTER — Other Ambulatory Visit: Payer: Self-pay

## 2019-08-15 DIAGNOSIS — Z1231 Encounter for screening mammogram for malignant neoplasm of breast: Secondary | ICD-10-CM

## 2019-08-17 ENCOUNTER — Other Ambulatory Visit: Payer: Self-pay | Admitting: Nurse Practitioner

## 2019-09-05 ENCOUNTER — Ambulatory Visit (INDEPENDENT_AMBULATORY_CARE_PROVIDER_SITE_OTHER): Payer: 59 | Admitting: Family Medicine

## 2019-09-05 ENCOUNTER — Other Ambulatory Visit: Payer: Self-pay

## 2019-09-05 ENCOUNTER — Encounter: Payer: Self-pay | Admitting: Family Medicine

## 2019-09-05 VITALS — BP 112/80 | HR 78 | Temp 97.3°F | Ht 64.75 in | Wt 197.9 lb

## 2019-09-05 DIAGNOSIS — I10 Essential (primary) hypertension: Secondary | ICD-10-CM

## 2019-09-05 DIAGNOSIS — Z0000 Encounter for general adult medical examination without abnormal findings: Secondary | ICD-10-CM

## 2019-09-05 DIAGNOSIS — R252 Cramp and spasm: Secondary | ICD-10-CM | POA: Diagnosis not present

## 2019-09-05 DIAGNOSIS — E876 Hypokalemia: Secondary | ICD-10-CM | POA: Diagnosis not present

## 2019-09-05 DIAGNOSIS — Z13220 Encounter for screening for lipoid disorders: Secondary | ICD-10-CM

## 2019-09-05 DIAGNOSIS — Z23 Encounter for immunization: Secondary | ICD-10-CM | POA: Diagnosis not present

## 2019-09-05 MED ORDER — HYDROCHLOROTHIAZIDE 25 MG PO TABS: 25.0000 mg | ORAL_TABLET | Freq: Every day | ORAL | 1 refills | Status: DC

## 2019-09-05 MED ORDER — AMLODIPINE BESYLATE 5 MG PO TABS: 5.0000 mg | ORAL_TABLET | Freq: Every day | ORAL | 1 refills | Status: DC

## 2019-09-05 NOTE — Progress Notes (Signed)
Tanya Richard DOB: 1965-11-14 Encounter date: 09/05/2019  This is a 54 y.o. female who presents for complete physical   History of present illness/Additional concerns: Feels good overall.   About a week ago got weird cramp in foot - if she bends foot in certain way, there is some aching up leg, tenderness. Feels like muscle. Not there all the time. Aching in calf; not shooting pain. No swelling in foot or leg. Sometimes feels like she is getting some pain up to hip, but this is not constant.   Still not sleeping well. Doesn't like taking medicine to help with sleep. This has been issue for her whole life.   Did see neurologist about issue with head discomfort - thought mostly stress causing symptoms.she feels better about this after having neurology visit.    HTN: amlodipine, hctz: has been on lower end - 113/80. Has also been taking turmeric.    Colonoscopy repeat due 12/2028 Mammogram: completed 6/1//21 normal Follows with nancy Young for gyn needs; last pap was 10/2018 normal (has follow up in August)  Has eye appointment in September.   Past Medical History:  Diagnosis Date  . Allergy   . Anxiety    COVID related anxiety - situational  . ELEVATED BP READING WITHOUT DX HYPERTENSION 10/14/2009  . GERD (gastroesophageal reflux disease)    Occ Pepcid AC depending on diet   . Hypertension   . Irregular heart beat    benign- no issues   . NEPHROLITHIASIS, HX OF 12/21/2007   Past Surgical History:  Procedure Laterality Date  . COLPOSCOPY    . GYNECOLOGIC CRYOSURGERY    . MOUTH SURGERY  1984   Allergies  Allergen Reactions  . Lisinopril Swelling    Angioedema of lip see ed visit 101 6   Current Meds  Medication Sig  . amLODipine (NORVASC) 5 MG tablet Take 1 tablet (5 mg total) by mouth daily.  . Calcium Carb-Cholecalciferol (CALCIUM 1000 + D) 1000-800 MG-UNIT TABS   . clonazePAM (KLONOPIN) 0.5 MG tablet Take 0.5-1 tablets (0.25-0.5 mg total) by mouth 2 (two) times daily as  needed for anxiety. (Patient taking differently: Take 0.25 mg by mouth at bedtime as needed for anxiety. )  . famotidine (PEPCID) 10 MG tablet Take 10 mg by mouth daily.   . fluticasone (FLONASE) 50 MCG/ACT nasal spray Place 1 spray into both nostrils daily.  . hydrochlorothiazide (HYDRODIURIL) 25 MG tablet Take 1 tablet (25 mg total) by mouth daily.  Marland Kitchen. KLOR-CON M10 10 MEQ tablet TAKE 2 TABLETS BY MOUTH TWICE A DAY X 2 DAYS, THEN TAKE 1 TABLET DAILY  . loratadine (CLARITIN) 10 MG tablet Take 10 mg by mouth daily as needed.   . Multiple Vitamin (MULTIVITAMIN) capsule Take 1 capsule by mouth daily.    . naproxen sodium (ANAPROX) 550 MG tablet Take 1 tablet (550 mg total) by mouth 2 (two) times daily with a meal. (Patient taking differently: Take 550 mg by mouth as needed. )  . nortriptyline (PAMELOR) 25 MG capsule TAKE 2 CAPSULES (50 MG TOTAL) BY MOUTH AT BEDTIME.  Marland Kitchen. Omega-3 Fatty Acids (FISH OIL PO) Take by mouth.  . traZODone (DESYREL) 50 MG tablet TAKE 0.5-1 TABLETS (25-50 MG TOTAL) BY MOUTH AT BEDTIME AS NEEDED FOR SLEEP.  Marland Kitchen. vitamin C (ASCORBIC ACID) 500 MG tablet Take 500 mg by mouth daily.  Marland Kitchen. VITAMIN E PO Take 400 Units by mouth daily.  . [DISCONTINUED] amLODipine (NORVASC) 5 MG tablet TAKE 1 TABLET BY MOUTH  EVERY DAY  . [DISCONTINUED] hydrochlorothiazide (HYDRODIURIL) 25 MG tablet TAKE 1 TABLET BY MOUTH EVERY DAY   Social History   Tobacco Use  . Smoking status: Never Smoker  . Smokeless tobacco: Never Used  Substance Use Topics  . Alcohol use: Not Currently    Alcohol/week: 0.0 standard drinks   Family History  Problem Relation Age of Onset  . Diabetes Mother   . Myasthenia gravis Mother   . Other Father        DVT  . Hypertension Father   . Pulmonary embolism Father        x2  . Diabetes Maternal Aunt   . Hypertension Maternal Grandmother   . Cancer Maternal Grandmother        MGGM-Uterine cancer  . Arthritis Neg Hx        family hx  . Colon cancer Neg Hx   . Colon  polyps Neg Hx   . Esophageal cancer Neg Hx   . Rectal cancer Neg Hx   . Stomach cancer Neg Hx      Review of Systems  Constitutional: Negative for activity change, appetite change, chills, fatigue, fever and unexpected weight change.  HENT: Negative for congestion, ear pain, hearing loss, sinus pressure, sinus pain, sore throat and trouble swallowing.   Eyes: Negative for pain and visual disturbance.  Respiratory: Negative for cough, chest tightness, shortness of breath and wheezing.   Cardiovascular: Negative for chest pain, palpitations and leg swelling.  Gastrointestinal: Negative for abdominal pain, blood in stool, constipation, diarrhea, nausea and vomiting.  Genitourinary: Negative for difficulty urinating and menstrual problem.  Musculoskeletal: Negative for arthralgias and back pain.  Skin: Negative for rash.  Neurological: Negative for dizziness, weakness, numbness and headaches.  Hematological: Negative for adenopathy. Does not bruise/bleed easily.  Psychiatric/Behavioral: Negative for sleep disturbance and suicidal ideas. The patient is not nervous/anxious.     CBC:  Lab Results  Component Value Date   WBC 8.7 09/05/2019   HGB 11.5 (L) 09/05/2019   HCT 34.2 (L) 09/05/2019   MCHC 33.8 09/05/2019   RDW 14.2 09/05/2019   PLT 332.0 09/05/2019   CMP: Lab Results  Component Value Date   NA 140 09/05/2019   K 3.0 (L) 09/05/2019   CL 100 09/05/2019   CO2 31 09/05/2019   GLUCOSE 85 09/05/2019   BUN 12 09/05/2019   CREATININE 0.47 09/05/2019   CALCIUM 9.2 09/05/2019   PROT 7.2 09/05/2019   BILITOT 0.4 09/05/2019   ALKPHOS 104 09/05/2019   ALT 8 09/05/2019   AST 11 09/05/2019   LIPID: Lab Results  Component Value Date   CHOL 182 09/05/2019   TRIG 66.0 09/05/2019   HDL 70.60 09/05/2019   LDLCALC 98 09/05/2019    Objective:  BP 112/80 (BP Location: Left Arm, Patient Position: Sitting, Cuff Size: Large)   Pulse 78   Temp (!) 97.3 F (36.3 C) (Temporal)    Ht 5' 4.75" (1.645 m)   Wt 197 lb 14.4 oz (89.8 kg)   SpO2 98%   BMI 33.19 kg/m   Weight: 197 lb 14.4 oz (89.8 kg)   BP Readings from Last 3 Encounters:  09/05/19 112/80  06/11/19 125/81  03/06/19 125/82   Wt Readings from Last 3 Encounters:  09/05/19 197 lb 14.4 oz (89.8 kg)  06/11/19 200 lb (90.7 kg)  04/24/19 194 lb 12.8 oz (88.4 kg)    Physical Exam Constitutional:      General: She is not in acute distress.  Appearance: She is well-developed.  HENT:     Head: Normocephalic and atraumatic.     Right Ear: External ear normal.     Left Ear: External ear normal.     Mouth/Throat:     Pharynx: No oropharyngeal exudate.  Eyes:     Conjunctiva/sclera: Conjunctivae normal.     Pupils: Pupils are equal, round, and reactive to light.  Neck:     Thyroid: No thyromegaly.  Cardiovascular:     Rate and Rhythm: Normal rate and regular rhythm.     Heart sounds: Normal heart sounds. No murmur heard.  No friction rub. No gallop.   Pulmonary:     Effort: Pulmonary effort is normal.     Breath sounds: Normal breath sounds.  Abdominal:     General: Bowel sounds are normal. There is no distension.     Palpations: Abdomen is soft. There is no mass.     Tenderness: There is no abdominal tenderness. There is no guarding.     Hernia: No hernia is present.  Musculoskeletal:        General: No tenderness or deformity. Normal range of motion.     Cervical back: Normal range of motion and neck supple.  Feet:     Comments: There is some tenderness with palpation of the arch of right foot.  There is some tenderness with palpation of the calf.  No noted edema, no color change. Lymphadenopathy:     Cervical: No cervical adenopathy.  Skin:    General: Skin is warm and dry.     Findings: No rash.  Neurological:     Mental Status: She is alert and oriented to person, place, and time.     Deep Tendon Reflexes: Reflexes normal.     Reflex Scores:      Tricep reflexes are 2+ on the right  side and 2+ on the left side.      Bicep reflexes are 2+ on the right side and 2+ on the left side.      Brachioradialis reflexes are 2+ on the right side and 2+ on the left side.      Patellar reflexes are 2+ on the right side and 2+ on the left side. Psychiatric:        Speech: Speech normal.        Behavior: Behavior normal.        Thought Content: Thought content normal.     Assessment/Plan: There are no preventive care reminders to display for this patient. Health Maintenance reviewed.  1. Preventative health care Discussed importance of keeping up with regular activity and healthy eating. - Hepatitis C antibody; Future - HIV Antibody (routine testing w rflx); Future - HIV Antibody (routine testing w rflx) - Hepatitis C antibody  2. Essential hypertension Blood pressures well controlled.  Continue current medications.  We will need to monitor potassium level as she has had low potassium with hydrochlorothiazide in the past.  Will determine follow-up pending blood work. - CBC with Differential/Platelet; Future - Comprehensive metabolic panel; Future - amLODipine (NORVASC) 5 MG tablet; Take 1 tablet (5 mg total) by mouth daily.  Dispense: 90 tablet; Refill: 1 - hydrochlorothiazide (HYDRODIURIL) 25 MG tablet; Take 1 tablet (25 mg total) by mouth daily.  Dispense: 90 tablet; Refill: 1 - Comprehensive metabolic panel - CBC with Differential/Platelet  3. Hypokalemia See above.  She is on potassium supplement. - Comprehensive metabolic panel; Future - Comprehensive metabolic panel  4. Lipid screening - Lipid panel;  Future - Lipid panel  5. Muscle cramp - Magnesium, RBC; Future - Vitamin B12; Future - TSH; Future - VITAMIN D 25 Hydroxy (Vit-D Deficiency, Fractures); Future - IBC + Ferritin; Future - IBC + Ferritin - VITAMIN D 25 Hydroxy (Vit-D Deficiency, Fractures) - TSH - Vitamin B12 - Magnesium, RBC  6. Need for shingles vaccine - Varicella-zoster vaccine IM  (Shingrix)  Return for pending bloodwork.  Theodis Shove, MD

## 2019-09-06 LAB — COMPREHENSIVE METABOLIC PANEL
ALT: 8 U/L (ref 0–35)
AST: 11 U/L (ref 0–37)
Albumin: 4 g/dL (ref 3.5–5.2)
Alkaline Phosphatase: 104 U/L (ref 39–117)
BUN: 12 mg/dL (ref 6–23)
CO2: 31 mEq/L (ref 19–32)
Calcium: 9.2 mg/dL (ref 8.4–10.5)
Chloride: 100 mEq/L (ref 96–112)
Creatinine, Ser: 0.47 mg/dL (ref 0.40–1.20)
GFR: 168.6 mL/min (ref >60.00)
Glucose, Bld: 85 mg/dL (ref 70–99)
Potassium: 3 mEq/L — ABNORMAL LOW (ref 3.5–5.1)
Sodium: 140 mEq/L (ref 135–145)
Total Bilirubin: 0.4 mg/dL (ref 0.2–1.2)
Total Protein: 7.2 g/dL (ref 6.0–8.3)

## 2019-09-06 LAB — CBC WITH DIFFERENTIAL/PLATELET
Basophils Absolute: 0.1 10*3/uL (ref 0.0–0.1)
Basophils Relative: 0.9 % (ref 0.0–3.0)
Eosinophils Absolute: 0.1 10*3/uL (ref 0.0–0.7)
Eosinophils Relative: 1.2 % (ref 0.0–5.0)
HCT: 34.2 % — ABNORMAL LOW (ref 36.0–46.0)
Hemoglobin: 11.5 g/dL — ABNORMAL LOW (ref 12.0–15.0)
Lymphocytes Relative: 35 % (ref 12.0–46.0)
Lymphs Abs: 3 10*3/uL (ref 0.7–4.0)
MCHC: 33.8 g/dL (ref 30.0–36.0)
MCV: 87.7 fl (ref 78.0–100.0)
Monocytes Absolute: 0.3 10*3/uL (ref 0.1–1.0)
Monocytes Relative: 3.4 % (ref 3.0–12.0)
Neutro Abs: 5.2 10*3/uL (ref 1.4–7.7)
Neutrophils Relative %: 59.5 % (ref 43.0–77.0)
Platelets: 332 10*3/uL (ref 150.0–400.0)
RBC: 3.9 Mil/uL (ref 3.87–5.11)
RDW: 14.2 % (ref 11.5–15.5)
WBC: 8.7 10*3/uL (ref 4.0–10.5)

## 2019-09-06 LAB — LIPID PANEL
Cholesterol: 182 mg/dL (ref 0–200)
HDL: 70.6 mg/dL (ref >39.00)
LDL Cholesterol: 98 mg/dL (ref 0–99)
NonHDL: 111.1
Total CHOL/HDL Ratio: 3
Triglycerides: 66 mg/dL (ref 0.0–149.0)
VLDL: 13.2 mg/dL (ref 0.0–40.0)

## 2019-09-06 LAB — VITAMIN B12: Vitamin B-12: 796 pg/mL (ref 211–911)

## 2019-09-06 LAB — TSH: TSH: 1.02 u[IU]/mL (ref 0.35–4.50)

## 2019-09-06 LAB — VITAMIN D 25 HYDROXY (VIT D DEFICIENCY, FRACTURES): VITD: 34.92 ng/mL (ref 30.00–100.00)

## 2019-09-06 LAB — IBC + FERRITIN
Ferritin: 68.7 ng/mL (ref 10.0–291.0)
Iron: 38 ug/dL — ABNORMAL LOW (ref 42–145)
Saturation Ratios: 10.8 % — ABNORMAL LOW (ref 20.0–50.0)
Transferrin: 252 mg/dL (ref 212.0–360.0)

## 2019-09-08 LAB — MAGNESIUM, RBC: Magnesium RBC: 6.3 mg/dL (ref 4.0–6.4)

## 2019-09-08 LAB — HEPATITIS C ANTIBODY
Hepatitis C Ab: NONREACTIVE
SIGNAL TO CUT-OFF: 0.01 (ref <1.00)

## 2019-09-08 LAB — HIV ANTIBODY (ROUTINE TESTING W REFLEX): HIV 1&2 Ab, 4th Generation: NONREACTIVE

## 2019-09-11 NOTE — Addendum Note (Signed)
Addended by: Johnella Moloney on: 09/11/2019 11:26 AM   Modules accepted: Orders

## 2019-09-21 ENCOUNTER — Other Ambulatory Visit: Payer: Self-pay

## 2019-09-21 DIAGNOSIS — E876 Hypokalemia: Secondary | ICD-10-CM

## 2019-10-02 ENCOUNTER — Other Ambulatory Visit: Payer: 59

## 2019-10-02 ENCOUNTER — Other Ambulatory Visit: Payer: Self-pay

## 2019-10-02 DIAGNOSIS — E876 Hypokalemia: Secondary | ICD-10-CM

## 2019-10-02 LAB — BASIC METABOLIC PANEL
BUN/Creatinine Ratio: 31 (calc) — ABNORMAL HIGH (ref 6–22)
BUN: 15 mg/dL (ref 7–25)
CO2: 29 mmol/L (ref 20–32)
Calcium: 9.2 mg/dL (ref 8.6–10.4)
Chloride: 104 mmol/L (ref 98–110)
Creat: 0.48 mg/dL — ABNORMAL LOW (ref 0.50–1.05)
Glucose, Bld: 93 mg/dL (ref 65–99)
Potassium: 3.8 mmol/L (ref 3.5–5.3)
Sodium: 141 mmol/L (ref 135–146)

## 2019-10-10 ENCOUNTER — Encounter: Payer: 59 | Admitting: Nurse Practitioner

## 2019-10-11 ENCOUNTER — Encounter: Payer: Self-pay | Admitting: Family Medicine

## 2019-10-17 ENCOUNTER — Encounter: Payer: 59 | Admitting: Nurse Practitioner

## 2019-11-05 ENCOUNTER — Ambulatory Visit: Payer: 59 | Admitting: Physician Assistant

## 2019-11-20 ENCOUNTER — Encounter: Payer: 59 | Admitting: Nurse Practitioner

## 2019-12-07 ENCOUNTER — Telehealth: Payer: Self-pay | Admitting: Neurology

## 2019-12-07 DIAGNOSIS — R519 Headache, unspecified: Secondary | ICD-10-CM

## 2019-12-07 NOTE — Telephone Encounter (Signed)
Pt states she is still having throbbing pain on right side of head, pt would not call it a headache but it is still throbbing pain.  Pt asking ot be called on if Dr Terrace Arabia wants her to come in or if she wants to go ahead and order the MRI as previously mentioned during appointment.  Please call.

## 2019-12-10 NOTE — Addendum Note (Signed)
Addended by: Levert Feinstein on: 12/10/2019 05:04 PM   Modules accepted: Orders

## 2019-12-10 NOTE — Telephone Encounter (Signed)
I spoke to the patient. She is aware of Dr. Zannie Cove response. She is agreeable to move forward with the MRI of brain.

## 2019-12-10 NOTE — Telephone Encounter (Signed)
Ok to order MRI brain  Orders Placed This Encounter  Procedures   MR BRAIN WO CONTRAST

## 2019-12-11 NOTE — Telephone Encounter (Signed)
LVM for pt to call back about scheduling [facility] Auth: [id number] (exp. 12/11/19 to 02/09/20

## 2019-12-11 NOTE — Telephone Encounter (Signed)
Patient returned my call she is scheduled at [facility] for 12/12/19. °

## 2019-12-12 ENCOUNTER — Other Ambulatory Visit: Payer: 59

## 2019-12-12 NOTE — Telephone Encounter (Signed)
Patient is r/s for 12/18/19.

## 2019-12-18 ENCOUNTER — Other Ambulatory Visit: Payer: 59

## 2019-12-18 ENCOUNTER — Other Ambulatory Visit: Payer: Self-pay

## 2019-12-18 ENCOUNTER — Ambulatory Visit: Payer: 59

## 2019-12-18 ENCOUNTER — Ambulatory Visit: Payer: 59 | Admitting: Physician Assistant

## 2019-12-18 DIAGNOSIS — R519 Headache, unspecified: Secondary | ICD-10-CM

## 2019-12-27 ENCOUNTER — Telehealth: Payer: Self-pay | Admitting: Neurology

## 2019-12-27 NOTE — Telephone Encounter (Signed)
Her MRI brain was normal. She did not see her mychart message. She has not taken the nortriptyline 25mg , two capsules at bedtime yet but plans to start this week. She understands to start with one capsule at bedtime for the first week then increase the dosage. She schedule a follow up on 03/17/20.

## 2019-12-27 NOTE — Telephone Encounter (Signed)
Pt called to check on status of MRI results. 

## 2019-12-31 ENCOUNTER — Encounter: Payer: Self-pay | Admitting: Family Medicine

## 2020-01-01 ENCOUNTER — Ambulatory Visit (INDEPENDENT_AMBULATORY_CARE_PROVIDER_SITE_OTHER): Payer: 59 | Admitting: Nurse Practitioner

## 2020-01-01 ENCOUNTER — Encounter: Payer: Self-pay | Admitting: Nurse Practitioner

## 2020-01-01 ENCOUNTER — Other Ambulatory Visit: Payer: Self-pay

## 2020-01-01 VITALS — BP 126/80 | Ht 64.0 in | Wt 200.0 lb

## 2020-01-01 DIAGNOSIS — Z23 Encounter for immunization: Secondary | ICD-10-CM | POA: Diagnosis not present

## 2020-01-01 DIAGNOSIS — N898 Other specified noninflammatory disorders of vagina: Secondary | ICD-10-CM

## 2020-01-01 DIAGNOSIS — E876 Hypokalemia: Secondary | ICD-10-CM | POA: Diagnosis not present

## 2020-01-01 DIAGNOSIS — Z01419 Encounter for gynecological examination (general) (routine) without abnormal findings: Secondary | ICD-10-CM

## 2020-01-01 NOTE — Progress Notes (Signed)
   Tanya Richard 11-12-1965 [id number]   History:  54 y.o. G0 presents for annual exam. Postmenopausal - no HRT, no bleeding. 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 She requests annual paps. Normal mammogram history. Complains of vaginal discharge with itching or odor. Not currently sexually active.   Gynecologic History No LMP recorded. (Menstrual status: Postmenopausal).   Contraception: post menopausal status Last Pap: 10/09/2018. Results were: normal Last mammogram: 08/17/2019. Results were: normal Last colonoscopy: 01/02/2019. Results were: normal Last Dexa: Never  Past medical history, past surgical history, family history and social history were all reviewed and documented in the EPIC chart.  ROS:  A ROS was performed and pertinent positives and negatives are included.  Exam:  Vitals:   01/01/20 1523  BP: 126/80  Weight: 200 lb (90.7 kg)  Height: 5\' 4"  (1.626 m)   Body mass index is 34.33 kg/m.  General appearance:  Normal Thyroid:  Symmetrical, normal in size, without palpable masses or nodularity. Respiratory  Auscultation:  Clear without wheezing or rhonchi Cardiovascular  Auscultation:  Regular rate, without rubs, murmurs or gallops  Edema/varicosities:  Not grossly evident Abdominal  Soft,nontender, without masses, guarding or rebound.  Liver/spleen:  No organomegaly noted  Hernia:  None appreciated  Skin  Inspection:  Grossly normal   Breasts: Examined lying and sitting.   Right: Without masses, retractions, discharge or axillary adenopathy.   Left: Without masses, retractions, discharge or axillary adenopathy. Gentitourinary   Inguinal/mons:  Normal without inguinal adenopathy  External genitalia:  Normal  BUS/Urethra/Skene's glands:  Normal  Vagina:  Moderate thick, white discharge, no erythema  Cervix:  Normal  Uterus:  Normal in size, shape and contour.  Midline and mobile  Adnexa/parametria:     Rt: Without masses or tenderness.   Lt: Without masses  or tenderness.  Anus and perineum: Normal  Digital rectal exam: Normal sphincter tone without palpated masses or tenderness  Wet prep negative  Assessment/Plan:  54 y.o. G0 for annual exam.   Well female exam with routine gynecological exam - Education provided on SBEs, importance of preventative screenings, current guidelines, high calcium diet, regular exercise, and multivitamin daily. Labs with PCP.   Vaginal discharge - Plan: WET PREP FOR TRICH, YEAST, CLUE. Wet prep unremarkable.   Hypokalemia - Plan: Basic Metabolic Panel (BMET) - Monitoring while taking Nortriptyline for nerve pain.   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 Pap today per request.  Screening for breast cancer - Normal mammogram history. Normal breast exam today. Continue annual screenings.   Screening for colon cancer - normal colonoscopy in 2020. Will repeat in 10 years for GI's recommendation.  Follow up in 1 year for annual.       2021 [facility], 3:34 PM 01/01/2020

## 2020-01-01 NOTE — Patient Instructions (Signed)
Health Maintenance, Female Adopting a healthy lifestyle and getting preventive care are important in promoting health and wellness. Ask your health care provider about:  The right schedule for you to have regular tests and exams.  Things you can do on your own to prevent diseases and keep yourself healthy. What should I know about diet, weight, and exercise? Eat a healthy diet   Eat a diet that includes plenty of vegetables, fruits, low-fat dairy products, and lean protein.  Do not eat a lot of foods that are high in solid fats, added sugars, or sodium. Maintain a healthy weight Body mass index (BMI) is used to identify weight problems. It estimates body fat based on height and weight. Your health care provider can help determine your BMI and help you achieve or maintain a healthy weight. Get regular exercise Get regular exercise. This is one of the most important things you can do for your health. Most adults should:  Exercise for at least 150 minutes each week. The exercise should increase your heart rate and make you sweat (moderate-intensity exercise).  Do strengthening exercises at least twice a week. This is in addition to the moderate-intensity exercise.  Spend less time sitting. Even light physical activity can be beneficial. Watch cholesterol and blood lipids Have your blood tested for lipids and cholesterol at 52 years of age, then have this test every 5 years. Have your cholesterol levels checked more often if:  Your lipid or cholesterol levels are high.  You are older than 52 years of age.  You are at high risk for heart disease. What should I know about cancer screening? Depending on your health history and family history, you may need to have cancer screening at various ages. This may include screening for:  Breast cancer.  Cervical cancer.  Colorectal cancer.  Skin cancer.  Lung cancer. What should I know about heart disease, diabetes, and high blood  pressure? Blood pressure and heart disease  High blood pressure causes heart disease and increases the risk of stroke. This is more likely to develop in people who have high blood pressure readings, are of African descent, or are overweight.  Have your blood pressure checked: ? Every 3-5 years if you are 18-39 years of age. ? Every year if you are 40 years old or older. Diabetes Have regular diabetes screenings. This checks your fasting blood sugar level. Have the screening done:  Once every three years after age 40 if you are at a normal weight and have a low risk for diabetes.  More often and at a younger age if you are overweight or have a high risk for diabetes. What should I know about preventing infection? Hepatitis B If you have a higher risk for hepatitis B, you should be screened for this virus. Talk with your health care provider to find out if you are at risk for hepatitis B infection. Hepatitis C Testing is recommended for:  Everyone born from 1945 through 1965.  Anyone with known risk factors for hepatitis C. Sexually transmitted infections (STIs)  Get screened for STIs, including gonorrhea and chlamydia, if: ? You are sexually active and are younger than 52 years of age. ? You are older than 52 years of age and your health care provider tells you that you are at risk for this type of infection. ? Your sexual activity has changed since you were last screened, and you are at increased risk for chlamydia or gonorrhea. Ask your health care provider if   you are at risk.  Ask your health care provider about whether you are at high risk for HIV. Your health care provider may recommend a prescription medicine to help prevent HIV infection. If you choose to take medicine to prevent HIV, you should first get tested for HIV. You should then be tested every 3 months for as long as you are taking the medicine. Pregnancy  If you are about to stop having your period (premenopausal) and  you may become pregnant, seek counseling before you get pregnant.  Take 400 to 800 micrograms (mcg) of folic acid every day if you become pregnant.  Ask for birth control (contraception) if you want to prevent pregnancy. Osteoporosis and menopause Osteoporosis is a disease in which the bones lose minerals and strength with aging. This can result in bone fractures. If you are 65 years old or older, or if you are at risk for osteoporosis and fractures, ask your health care provider if you should:  Be screened for bone loss.  Take a calcium or vitamin D supplement to lower your risk of fractures.  Be given hormone replacement therapy (HRT) to treat symptoms of menopause. Follow these instructions at home: Lifestyle  Do not use any products that contain nicotine or tobacco, such as cigarettes, e-cigarettes, and chewing tobacco. If you need help quitting, ask your health care provider.  Do not use street drugs.  Do not share needles.  Ask your health care provider for help if you need support or information about quitting drugs. Alcohol use  Do not drink alcohol if: ? Your health care provider tells you not to drink. ? You are pregnant, may be pregnant, or are planning to become pregnant.  If you drink alcohol: ? Limit how much you use to 0-1 drink a day. ? Limit intake if you are breastfeeding.  Be aware of how much alcohol is in your drink. In the [place] one drink equals one 12 oz bottle of beer (355 mL), one 5 oz glass of wine (148 mL), or one 1 oz glass of hard liquor (44 mL). General instructions  Schedule regular health, dental, and eye exams.  Stay current with your vaccines.  Tell your health care provider if: ? You often feel depressed. ? You have ever been abused or do not feel safe at home. Summary  Adopting a healthy lifestyle and getting preventive care are important in promoting health and wellness.  Follow your health care provider's instructions about healthy  diet, exercising, and getting tested or screened for diseases.  Follow your health care provider's instructions on monitoring your cholesterol and blood pressure. This information is not intended to replace advice given to you by your health care provider. Make sure you discuss any questions you have with your health care provider. Document Revised: 02/15/2018 Document Reviewed: 02/15/2018 Elsevier Patient Education  2020 [employer]  Menopause Menopause is the normal time of life when menstrual periods stop completely. It is usually confirmed by 12 months without a menstrual period. The transition to menopause (perimenopause) most often happens between the ages of 45 and 55. During perimenopause, hormone levels change in your body, which can cause symptoms and affect your health. Menopause may increase your risk for:  Loss of bone (osteoporosis), which causes bone breaks (fractures).  Depression.  Hardening and narrowing of the arteries (atherosclerosis), which can cause heart attacks and strokes. What are the causes? This condition is usually caused by a natural change in hormone levels that happens as you   get older. The condition may also be caused by surgery to remove both ovaries (bilateral oophorectomy). What increases the risk? This condition is more likely to start at an earlier age if you have certain medical conditions or treatments, including:  A tumor of the pituitary gland in the brain.  A disease that affects the ovaries and hormone production.  Radiation treatment for cancer.  Certain cancer treatments, such as chemotherapy or hormone (anti-estrogen) therapy.  Heavy smoking and excessive alcohol use.  Family history of early menopause. This condition is also more likely to develop earlier in women who are very thin. What are the signs or symptoms? Symptoms of this condition include:  Hot flashes.  Irregular menstrual periods.  Night sweats.  Changes in  feelings about sex. This could be a decrease in sex drive or an increased comfort around your sexuality.  Vaginal dryness and thinning of the vaginal walls. This may cause painful intercourse.  Dryness of the skin and development of wrinkles.  Headaches.  Problems sleeping (insomnia).  Mood swings or irritability.  Memory problems.  Weight gain.  Hair growth on the face and chest.  Bladder infections or problems with urinating. How is this diagnosed? This condition is diagnosed based on your medical history, a physical exam, your age, your menstrual history, and your symptoms. Hormone tests may also be done. How is this treated? In some cases, no treatment is needed. You and your health care provider should make a decision together about whether treatment is necessary. Treatment will be based on your individual condition and preferences. Treatment for this condition focuses on managing symptoms. Treatment may include:  Menopausal hormone therapy (MHT).  Medicines to treat specific symptoms or complications.  Acupuncture.  Vitamin or herbal supplements. Before starting treatment, make sure to let your health care provider know if you have a personal or family history of:  Heart disease.  Breast cancer.  Blood clots.  Diabetes.  Osteoporosis. Follow these instructions at home: Lifestyle  Do not use any products that contain nicotine or tobacco, such as cigarettes and e-cigarettes. If you need help quitting, ask your health care provider.  Get at least 30 minutes of physical activity on 5 or more days each week.  Avoid alcoholic and caffeinated beverages, as well as spicy foods. This may help prevent hot flashes.  Get 7-8 hours of sleep each night.  If you have hot flashes, try: ? Dressing in layers. ? Avoiding things that may trigger hot flashes, such as spicy food, warm places, or stress. ? Taking slow, deep breaths when a hot flash starts. ? Keeping a fan in  your home and office.  Find ways to manage stress, such as deep breathing, meditation, or journaling.  Consider going to group therapy with other women who are having menopause symptoms. Ask your health care provider about recommended group therapy meetings. Eating and drinking  Eat a healthy, balanced diet that contains whole grains, lean protein, low-fat dairy, and plenty of fruits and vegetables.  Your health care provider may recommend adding more soy to your diet. Foods that contain soy include tofu, tempeh, and soy milk.  Eat plenty of foods that contain calcium and vitamin D for bone health. Items that are rich in calcium include low-fat milk, yogurt, beans, almonds, sardines, broccoli, and kale. Medicines  Take over-the-counter and prescription medicines only as told by your health care provider.  Talk with your health care provider before starting any herbal supplements. If prescribed, take vitamins and supplements   as told by your health care provider. These may include: ? Calcium. Women age 51 and older should get 1,200 mg (milligrams) of calcium every day. ? Vitamin D. Women need 600-800 International Units of vitamin D each day. ? Vitamins B12 and B6. Aim for 50 micrograms of B12 and 1.5 mg of B6 each day. General instructions  Keep track of your menstrual periods, including: ? When they occur. ? How heavy they are and how long they last. ? How much time passes between periods.  Keep track of your symptoms, noting when they start, how often you have them, and how long they last.  Use vaginal lubricants or moisturizers to help with vaginal dryness and improve comfort during sex.  Keep all follow-up visits as told by your health care provider. This is important. This includes any group therapy or counseling. Contact a health care provider if:  You are still having menstrual periods after age 55.  You have pain during sex.  You have not had a period for 12 months and  you develop vaginal bleeding. Get help right away if:  You have: ? Severe depression. ? Excessive vaginal bleeding. ? Pain when you urinate. ? A fast or irregular heart beat (palpitations). ? Severe headaches. ? Abdomen (abdominal) pain or severe indigestion.  You fell and you think you have a broken bone.  You develop leg or chest pain.  You develop vision problems.  You feel a lump in your breast. Summary  Menopause is the normal time of life when menstrual periods stop completely. It is usually confirmed by 12 months without a menstrual period.  The transition to menopause (perimenopause) most often happens between the ages of 45 and 55.  Symptoms can be managed through medicines, lifestyle changes, and complementary therapies such as acupuncture.  Eat a balanced diet that is rich in nutrients to promote bone health and heart health and to manage symptoms during menopause. This information is not intended to replace advice given to you by your health care provider. Make sure you discuss any questions you have with your health care provider. Document Revised: 02/04/2017 Document Reviewed: 03/27/2016 Elsevier Patient Education  2020 Elsevier Inc.  

## 2020-01-02 LAB — WET PREP FOR TRICH, YEAST, CLUE

## 2020-01-02 LAB — PAP IG W/ RFLX HPV ASCU

## 2020-01-02 LAB — BASIC METABOLIC PANEL
BUN/Creatinine Ratio: 27 (calc) — ABNORMAL HIGH (ref 6–22)
BUN: 13 mg/dL (ref 7–25)
CO2: 27 mmol/L (ref 20–32)
Calcium: 9.6 mg/dL (ref 8.6–10.4)
Chloride: 106 mmol/L (ref 98–110)
Creat: 0.48 mg/dL — ABNORMAL LOW (ref 0.50–1.05)
Glucose, Bld: 91 mg/dL (ref 65–99)
Potassium: 3.4 mmol/L — ABNORMAL LOW (ref 3.5–5.3)
Sodium: 140 mmol/L (ref 135–146)

## 2020-01-11 ENCOUNTER — Ambulatory Visit (INDEPENDENT_AMBULATORY_CARE_PROVIDER_SITE_OTHER): Payer: 59 | Admitting: *Deleted

## 2020-01-11 ENCOUNTER — Other Ambulatory Visit: Payer: Self-pay

## 2020-01-11 DIAGNOSIS — Z23 Encounter for immunization: Secondary | ICD-10-CM

## 2020-01-28 ENCOUNTER — Other Ambulatory Visit: Payer: Self-pay | Admitting: Family Medicine

## 2020-03-17 ENCOUNTER — Ambulatory Visit: Payer: Self-pay | Admitting: Neurology

## 2020-04-01 ENCOUNTER — Telehealth: Payer: Self-pay | Admitting: *Deleted

## 2020-04-01 NOTE — Telephone Encounter (Signed)
Per PCP I left a detailed message at the pts home number asking if she could arrive at 8:15am for the appt on 1/27 instead of 7:45 due to scheduling error.  Requested pt call back to let us know if this time would work for her otherwise PCP stated she may be a little late for the appt.

## 2020-04-02 ENCOUNTER — Other Ambulatory Visit: Payer: Self-pay

## 2020-04-02 NOTE — Telephone Encounter (Signed)
Patient called back and agreed to arrive at 8:15am on 1/27.

## 2020-04-03 ENCOUNTER — Ambulatory Visit (INDEPENDENT_AMBULATORY_CARE_PROVIDER_SITE_OTHER): Payer: 59 | Admitting: Family Medicine

## 2020-04-03 ENCOUNTER — Encounter: Payer: Self-pay | Admitting: Family Medicine

## 2020-04-03 ENCOUNTER — Ambulatory Visit (INDEPENDENT_AMBULATORY_CARE_PROVIDER_SITE_OTHER): Payer: 59

## 2020-04-03 VITALS — BP 118/80 | HR 82 | Temp 98.5°F | Ht 64.0 in | Wt 203.1 lb

## 2020-04-03 DIAGNOSIS — M549 Dorsalgia, unspecified: Secondary | ICD-10-CM

## 2020-04-03 DIAGNOSIS — R002 Palpitations: Secondary | ICD-10-CM

## 2020-04-03 DIAGNOSIS — I10 Essential (primary) hypertension: Secondary | ICD-10-CM

## 2020-04-03 DIAGNOSIS — R5383 Other fatigue: Secondary | ICD-10-CM | POA: Diagnosis not present

## 2020-04-03 DIAGNOSIS — G629 Polyneuropathy, unspecified: Secondary | ICD-10-CM | POA: Diagnosis not present

## 2020-04-03 DIAGNOSIS — R0789 Other chest pain: Secondary | ICD-10-CM

## 2020-04-03 LAB — CBC WITH DIFFERENTIAL/PLATELET
Basophils Absolute: 0.1 10*3/uL (ref 0.0–0.1)
Basophils Relative: 0.8 % (ref 0.0–3.0)
Eosinophils Absolute: 0 10*3/uL (ref 0.0–0.7)
Eosinophils Relative: 0.7 % (ref 0.0–5.0)
HCT: 35.4 % — ABNORMAL LOW (ref 36.0–46.0)
Hemoglobin: 11.9 g/dL — ABNORMAL LOW (ref 12.0–15.0)
Lymphocytes Relative: 29.8 % (ref 12.0–46.0)
Lymphs Abs: 2.3 10*3/uL (ref 0.7–4.0)
MCHC: 33.7 g/dL (ref 30.0–36.0)
MCV: 85.4 fl (ref 78.0–100.0)
Monocytes Absolute: 0.3 10*3/uL (ref 0.1–1.0)
Monocytes Relative: 3.4 % (ref 3.0–12.0)
Neutro Abs: 5 10*3/uL (ref 1.4–7.7)
Neutrophils Relative %: 65.3 % (ref 43.0–77.0)
Platelets: 356 10*3/uL (ref 150.0–400.0)
RBC: 4.15 Mil/uL (ref 3.87–5.11)
RDW: 14.7 % (ref 11.5–15.5)
WBC: 7.6 10*3/uL (ref 4.0–10.5)

## 2020-04-03 LAB — COMPREHENSIVE METABOLIC PANEL
ALT: 9 U/L (ref 0–35)
AST: 12 U/L (ref 0–37)
Albumin: 3.9 g/dL (ref 3.5–5.2)
Alkaline Phosphatase: 119 U/L — ABNORMAL HIGH (ref 39–117)
BUN: 13 mg/dL (ref 6–23)
CO2: 33 mEq/L — ABNORMAL HIGH (ref 19–32)
Calcium: 9.6 mg/dL (ref 8.4–10.5)
Chloride: 103 mEq/L (ref 96–112)
Creatinine, Ser: 0.51 mg/dL (ref 0.40–1.20)
GFR: 107.46 mL/min (ref >60.00)
Glucose, Bld: 90 mg/dL (ref 70–99)
Potassium: 3.2 mEq/L — ABNORMAL LOW (ref 3.5–5.1)
Sodium: 142 mEq/L (ref 135–145)
Total Bilirubin: 0.4 mg/dL (ref 0.2–1.2)
Total Protein: 8 g/dL (ref 6.0–8.3)

## 2020-04-03 LAB — TSH: TSH: 0.87 u[IU]/mL (ref 0.35–4.50)

## 2020-04-03 NOTE — Progress Notes (Signed)
Lucendia Leard DOB: Jul 01, 1965 Encounter date: 04/03/2020  This is a 55 y.o. female who presents with Chief Complaint  Patient presents with  . Palpitations    X1 month, worse past 2 weeks  . Numbness    Right arm x2 weeks  . Back Pain    Patient complains of aching sensation intrascapular region x1 month, no known injury    History of present illness: In last 2-4 weeks has noted irregular heart beat more. She knows she has had this before but hasn't felt it/noted it. Also has some right fingers and sometimes right upper arm numbness - not sure if related to way that she is sleeping.   Notes that she is getting some runs of faster heart rate - lasting a few minutes. Not noting any additional sx with palpitations - no shortness of breath, no dizziness, no sweating. Does state that she has noted a heaviness in chest; she feels this all the time. Usually there in morning, but comes through day as well. No neck pain. Has appointment coming up with neuro; had to reschedule to to weather. She is not noting weakness in right hand/arm.   She also has some aching between shoulder blades. Not sure if muscular/arthritis. Doesn't stop her from doing anything, just something that nags and that she notices.   She has been working on [employer]. She is working more and a little more sedentary. Tying to cut out fast food. Working on more fresh fruits/veggies. She does drink a lot of calories but is trying to cut that down.   Not sleeping well. Only getting about 3 hours of sleep. She has new noisy neighbor.  HPI   Allergies  Allergen Reactions  . Lisinopril Swelling    Angioedema of lip see ed visit 101 6   Current Meds  Medication Sig  . amLODipine (NORVASC) 5 MG tablet Take 1 tablet (5 mg total) by mouth daily.  . Calcium Carb-Cholecalciferol (CALCIUM 1000 + D) 1000-800 MG-UNIT TABS   . clonazePAM (KLONOPIN) 0.5 MG tablet Take 0.5-1 tablets (0.25-0.5 mg total) by mouth 2 (two) times daily  as needed for anxiety. (Patient taking differently: Take 0.25 mg by mouth at bedtime as needed for anxiety.)  . famotidine (PEPCID) 10 MG tablet Take 10 mg by mouth daily.   . fluticasone (FLONASE) 50 MCG/ACT nasal spray Place 1 spray into both nostrils daily.  . hydrochlorothiazide (HYDRODIURIL) 25 MG tablet Take 1 tablet (25 mg total) by mouth daily.  Marland Kitchen KLOR-CON M10 10 MEQ tablet TAKE 2 TABLETS BY MOUTH TWICE A DAY X 2 DAYS, THEN TAKE 1 TABLET DAILY  . loratadine (CLARITIN) 10 MG tablet Take 10 mg by mouth daily as needed.  . Multiple Vitamin (MULTIVITAMIN) capsule Take 1 capsule by mouth daily.  . naproxen sodium (ANAPROX) 550 MG tablet Take 1 tablet (550 mg total) by mouth 2 (two) times daily with a meal. (Patient taking differently: Take 550 mg by mouth as needed.)  . nortriptyline (PAMELOR) 25 MG capsule TAKE 2 CAPSULES (50 MG TOTAL) BY MOUTH AT BEDTIME.  Marland Kitchen Omega-3 Fatty Acids (FISH OIL PO) Take by mouth.  . traZODone (DESYREL) 50 MG tablet TAKE 0.5-1 TABLETS (25-50 MG TOTAL) BY MOUTH AT BEDTIME AS NEEDED FOR SLEEP.  Marland Kitchen vitamin C (ASCORBIC ACID) 500 MG tablet Take 500 mg by mouth daily.  Marland Kitchen VITAMIN E PO Take 400 Units by mouth daily.    Review of Systems  Constitutional: Positive for fatigue (always tired; doesn't sleep  well. ). Negative for chills and fever.  Respiratory: Negative for cough, chest tightness, shortness of breath and wheezing.   Cardiovascular: Positive for palpitations. Negative for chest pain and leg swelling.  Musculoskeletal: Positive for back pain (see hpi). Negative for neck pain and neck stiffness.  Neurological: Positive for numbness. Negative for weakness and headaches.  Psychiatric/Behavioral: Positive for sleep disturbance (doesn't like taking sleep medication nightly; tries to do without). The patient is nervous/anxious.     Objective:  BP 118/80 (BP Location: Left Arm, Patient Position: Sitting, Cuff Size: Large)   Pulse 82   Temp 98.5 F (36.9 C)  (Axillary)   Ht 5\' 4"  (1.626 m)   Wt 203 lb 1.6 oz (92.1 kg)   SpO2 98%   BMI 34.86 kg/m   Weight: 203 lb 1.6 oz (92.1 kg)   BP Readings from Last 3 Encounters:  04/03/20 118/80  01/01/20 126/80  09/05/19 112/80   Wt Readings from Last 3 Encounters:  04/03/20 203 lb 1.6 oz (92.1 kg)  01/01/20 200 lb (90.7 kg)  09/05/19 197 lb 14.4 oz (89.8 kg)    Physical Exam Constitutional:      General: She is not in acute distress.    Appearance: She is well-developed.  Cardiovascular:     Rate and Rhythm: Normal rate and regular rhythm.     Heart sounds: Normal heart sounds. No murmur heard. No friction rub.  Pulmonary:     Effort: Pulmonary effort is normal. No respiratory distress.     Breath sounds: Normal breath sounds. No wheezing or rales.  Musculoskeletal:     Right lower leg: No edema.     Left lower leg: No edema.     Comments: Full ROM of neck without pain. Negative spurlings. There is mild para cervical/upper thoracic muscular tenderness to palpation.   Neurological:     Mental Status: She is alert and oriented to person, place, and time.     Deep Tendon Reflexes:     Reflex Scores:      Tricep reflexes are 1+ on the right side and 1+ on the left side.      Bicep reflexes are 1+ on the right side and 1+ on the left side.      Brachioradialis reflexes are 1+ on the right side and 1+ on the left side.    Comments: Full strength bilat upper extremities  Psychiatric:        Behavior: Behavior normal.     Assessment/Plan  1. Essential hypertension Well controlled. Continue current medication: amlodipine 5mg  daily, hctz 25mg  daily, potassium supplement. Will recheck blodowork today. - CBC with Differential/Platelet; Future - Comprehensive metabolic panel; Future  2. Palpitations Start with bloodwork, heart monitor ordered for further eval as exam in office was benign today.  EKG stable, no acute changes. Sinus rythym. - TSH; Future - EKG 12-Lead; Future - DG Chest 2  View; Future - Cardiac event monitor; Future  3. Neuropathy Has fu with neurology next week. Exam was normal for me today, may need further nerve conduction testing if not improving with sx; will start with bloodwork today though. B12 was normal 08/2019  4. Fatigue, unspecified type Chronic; thinks sleep related. She is continuing to work on improving sleep setting at home to help with better sleep. She does get sleep with trazodone when she takes it.  5. Upper back pain Suspect muscular. Follow up pending other evalutation results. - DG Chest 2 View; Future  6. Chest pressure See  EKG read above. May have anxiety component as well. We also discussed consideration for echo heart pending other results, but exam does not give red flags for structural abnormality. - DG Chest 2 View; Future    Return for pending labs, imaging.     Theodis Shove, MD

## 2020-04-08 NOTE — Progress Notes (Signed)
*  Thyroid looks good *Potassium levels are still low.  I would have her increase to 20 mEq daily.  If she needs any prescription okay to send this in.  Her potassium is low likely secondary to the hydrochlorothiazide.  We could discuss stopping this medication, but blood pressures been well controlled on it.  If she is okay with taking the potassium along with her medications I would just encourage her to do this. *Kidney function, electrolytes, blood sugar, liver function look good. *Blood counts are stable.

## 2020-04-09 ENCOUNTER — Ambulatory Visit (INDEPENDENT_AMBULATORY_CARE_PROVIDER_SITE_OTHER): Payer: 59

## 2020-04-09 DIAGNOSIS — R002 Palpitations: Secondary | ICD-10-CM | POA: Diagnosis not present

## 2020-04-21 ENCOUNTER — Other Ambulatory Visit: Payer: Self-pay | Admitting: Family Medicine

## 2020-04-28 ENCOUNTER — Other Ambulatory Visit: Payer: Self-pay | Admitting: Family Medicine

## 2020-04-28 DIAGNOSIS — R002 Palpitations: Secondary | ICD-10-CM

## 2020-05-22 ENCOUNTER — Other Ambulatory Visit: Payer: Self-pay

## 2020-05-22 ENCOUNTER — Ambulatory Visit (HOSPITAL_COMMUNITY): Payer: 59 | Attending: Cardiology

## 2020-05-22 DIAGNOSIS — R002 Palpitations: Secondary | ICD-10-CM | POA: Insufficient documentation

## 2020-05-22 LAB — ECHOCARDIOGRAM COMPLETE
Area-P 1/2: 3.6 cm2
S' Lateral: 3.3 cm

## 2020-05-28 ENCOUNTER — Ambulatory Visit: Payer: Self-pay | Admitting: Neurology

## 2020-05-28 NOTE — Progress Notes (Signed)
Noted  

## 2020-06-06 ENCOUNTER — Ambulatory Visit: Payer: 59 | Admitting: Family Medicine

## 2020-06-15 ENCOUNTER — Other Ambulatory Visit: Payer: Self-pay | Admitting: Family Medicine

## 2020-06-15 DIAGNOSIS — I10 Essential (primary) hypertension: Secondary | ICD-10-CM

## 2020-07-17 ENCOUNTER — Other Ambulatory Visit: Payer: Self-pay | Admitting: Family Medicine

## 2020-07-17 DIAGNOSIS — Z1231 Encounter for screening mammogram for malignant neoplasm of breast: Secondary | ICD-10-CM

## 2020-09-02 ENCOUNTER — Other Ambulatory Visit: Payer: Self-pay | Admitting: Family Medicine

## 2020-09-12 ENCOUNTER — Ambulatory Visit
Admission: RE | Admit: 2020-09-12 | Discharge: 2020-09-12 | Disposition: A | Discharge status: Home / Self Care | Payer: 59 | Source: Ambulatory Visit | Attending: Family Medicine | Admitting: Family Medicine

## 2020-09-12 ENCOUNTER — Other Ambulatory Visit: Payer: Self-pay

## 2020-09-12 DIAGNOSIS — Z1231 Encounter for screening mammogram for malignant neoplasm of breast: Secondary | ICD-10-CM

## 2020-09-24 ENCOUNTER — Other Ambulatory Visit: Payer: Self-pay | Admitting: Family Medicine

## 2020-09-24 DIAGNOSIS — I10 Essential (primary) hypertension: Secondary | ICD-10-CM

## 2020-10-20 ENCOUNTER — Other Ambulatory Visit: Payer: Self-pay

## 2020-10-20 ENCOUNTER — Ambulatory Visit (INDEPENDENT_AMBULATORY_CARE_PROVIDER_SITE_OTHER): Payer: 59 | Admitting: Family Medicine

## 2020-10-20 ENCOUNTER — Encounter: Payer: Self-pay | Admitting: Family Medicine

## 2020-10-20 VITALS — BP 108/80 | HR 95 | Temp 99.5°F | Ht 63.75 in | Wt 208.2 lb

## 2020-10-20 DIAGNOSIS — E876 Hypokalemia: Secondary | ICD-10-CM

## 2020-10-20 DIAGNOSIS — Z13220 Encounter for screening for lipoid disorders: Secondary | ICD-10-CM | POA: Diagnosis not present

## 2020-10-20 DIAGNOSIS — M722 Plantar fascial fibromatosis: Secondary | ICD-10-CM

## 2020-10-20 DIAGNOSIS — Z0000 Encounter for general adult medical examination without abnormal findings: Secondary | ICD-10-CM | POA: Diagnosis not present

## 2020-10-20 DIAGNOSIS — I10 Essential (primary) hypertension: Secondary | ICD-10-CM

## 2020-10-20 DIAGNOSIS — Z131 Encounter for screening for diabetes mellitus: Secondary | ICD-10-CM | POA: Diagnosis not present

## 2020-10-20 DIAGNOSIS — R5383 Other fatigue: Secondary | ICD-10-CM | POA: Diagnosis not present

## 2020-10-20 LAB — COMPREHENSIVE METABOLIC PANEL
ALT: 10 U/L (ref 0–35)
AST: 13 U/L (ref 0–37)
Albumin: 4.1 g/dL (ref 3.5–5.2)
Alkaline Phosphatase: 133 U/L — ABNORMAL HIGH (ref 39–117)
BUN: 13 mg/dL (ref 6–23)
CO2: 27 mEq/L (ref 19–32)
Calcium: 9.7 mg/dL (ref 8.4–10.5)
Chloride: 99 mEq/L (ref 96–112)
Creatinine, Ser: 0.47 mg/dL (ref 0.40–1.20)
GFR: 109.17 mL/min (ref >60.00)
Glucose, Bld: 85 mg/dL (ref 70–99)
Potassium: 3.2 mEq/L — ABNORMAL LOW (ref 3.5–5.1)
Sodium: 140 mEq/L (ref 135–145)
Total Bilirubin: 0.5 mg/dL (ref 0.2–1.2)
Total Protein: 7.7 g/dL (ref 6.0–8.3)

## 2020-10-20 LAB — CBC WITH DIFFERENTIAL/PLATELET
Basophils Absolute: 0 10*3/uL (ref 0.0–0.1)
Basophils Relative: 0.4 % (ref 0.0–3.0)
Eosinophils Absolute: 0.1 10*3/uL (ref 0.0–0.7)
Eosinophils Relative: 0.7 % (ref 0.0–5.0)
HCT: 37.2 % (ref 36.0–46.0)
Hemoglobin: 12.1 g/dL (ref 12.0–15.0)
Lymphocytes Relative: 29.9 % (ref 12.0–46.0)
Lymphs Abs: 2.8 10*3/uL (ref 0.7–4.0)
MCHC: 32.6 g/dL (ref 30.0–36.0)
MCV: 86.3 fl (ref 78.0–100.0)
Monocytes Absolute: 0.3 10*3/uL (ref 0.1–1.0)
Monocytes Relative: 3.6 % (ref 3.0–12.0)
Neutro Abs: 6.1 10*3/uL (ref 1.4–7.7)
Neutrophils Relative %: 65.4 % (ref 43.0–77.0)
Platelets: 367 10*3/uL (ref 150.0–400.0)
RBC: 4.31 Mil/uL (ref 3.87–5.11)
RDW: 14.8 % (ref 11.5–15.5)
WBC: 9.4 10*3/uL (ref 4.0–10.5)

## 2020-10-20 LAB — LIPID PANEL
Cholesterol: 201 mg/dL — ABNORMAL HIGH (ref 0–200)
HDL: 76.9 mg/dL (ref >39.00)
LDL Cholesterol: 107 mg/dL — ABNORMAL HIGH (ref 0–99)
NonHDL: 124.11
Total CHOL/HDL Ratio: 3
Triglycerides: 87 mg/dL (ref 0.0–149.0)
VLDL: 17.4 mg/dL (ref 0.0–40.0)

## 2020-10-20 LAB — FERRITIN: Ferritin: 69.1 ng/mL (ref 10.0–291.0)

## 2020-10-20 LAB — VITAMIN D 25 HYDROXY (VIT D DEFICIENCY, FRACTURES): VITD: 35.27 ng/mL (ref 30.00–100.00)

## 2020-10-20 LAB — FOLATE: Folate: >24.4 ng/mL (ref >5.9)

## 2020-10-20 LAB — HEMOGLOBIN A1C: Hgb A1c MFr Bld: 6 % (ref 4.6–6.5)

## 2020-10-20 LAB — VITAMIN B12: Vitamin B-12: 716 pg/mL (ref 211–911)

## 2020-10-20 LAB — TSH: TSH: 0.98 u[IU]/mL (ref 0.35–5.50)

## 2020-10-20 NOTE — Progress Notes (Signed)
Tanya Richard DOB: March 21, 1965 Encounter date: 10/20/2020  This is a 55 y.o. female who presents for complete physical   History of present illness/Additional concerns:  If turning head to quickly to left sometimes she gets shooting pain up side of right head. Doesn't linger. No headaches. Has noticed this off and on for a couple of months. No radiation down arm. No ongoing neck pain. Not sure why sometimes triggered but not consistently. Not noting numbness of right arm like she was when here before. Numbness in feet went away. Pain between shoulder blades also comes and goes. Notes more when not taking potassium. Still has this sometimes but better than in past.   Always tired. Started therapy again about a month ago. Working on stopping her worry. She is feeling overwhelmed with worrying all the time.   Upset with weight gain - not walking like normal due to hot weather. Knows what she needs to do, but she is not a good eater. Doesn't eat a lot but not good choices when she does eat. Does drink a lot of sweet tea in a day- 3-4 glasses.   Follows with gyn regularly Colonoscopy due 12/2028  Past Medical History:  Diagnosis Date   Allergy    Anxiety    COVID related anxiety - situational   ELEVATED BP READING WITHOUT DX HYPERTENSION 10/14/2009   GERD (gastroesophageal reflux disease)    Occ Pepcid AC depending on diet    Hypertension    Irregular heart beat    benign- no issues    NEPHROLITHIASIS, HX OF 12/21/2007   Past Surgical History:  Procedure Laterality Date   COLPOSCOPY     GYNECOLOGIC CRYOSURGERY     MOUTH SURGERY  1984   Allergies  Allergen Reactions   Lisinopril Swelling    Angioedema of lip see ed visit 101 6   Current Meds  Medication Sig   amLODipine (NORVASC) 5 MG tablet TAKE 1 TABLET BY MOUTH EVERY DAY   Calcium Carb-Cholecalciferol (CALCIUM 1000 + D) 1000-800 MG-UNIT TABS    clonazePAM (KLONOPIN) 0.5 MG tablet Take 0.5-1 tablets (0.25-0.5 mg total) by mouth 2  (two) times daily as needed for anxiety. (Patient taking differently: Take 0.25 mg by mouth at bedtime as needed for anxiety.)   famotidine (PEPCID) 10 MG tablet Take 10 mg by mouth daily.    fluticasone (FLONASE) 50 MCG/ACT nasal spray Place 1 spray into both nostrils daily.   hydrochlorothiazide (HYDRODIURIL) 25 MG tablet TAKE 1 TABLET BY MOUTH EVERY DAY   KLOR-CON M10 10 MEQ tablet TAKE 2 TABLETS BY MOUTH TWICE A DAY FOR 2 DAYS, THEN TAKE 1 TABLET DAILY (Patient taking differently: Take 20 mEq by mouth daily.)   loratadine (CLARITIN) 10 MG tablet Take 10 mg by mouth daily as needed.   Multiple Vitamin (MULTIVITAMIN) capsule Take 1 capsule by mouth daily.   naproxen sodium (ANAPROX) 550 MG tablet Take 1 tablet (550 mg total) by mouth 2 (two) times daily with a meal. (Patient taking differently: Take 550 mg by mouth as needed.)   nortriptyline (PAMELOR) 25 MG capsule TAKE 2 CAPSULES (50 MG TOTAL) BY MOUTH AT BEDTIME. (Patient taking differently: Take 50 mg by mouth daily as needed.)   Omega-3 Fatty Acids (FISH OIL PO) Take by mouth.   vitamin C (ASCORBIC ACID) 500 MG tablet Take 500 mg by mouth daily.   VITAMIN D PO Take by mouth daily at 6 (six) AM.   VITAMIN E PO Take 400 Units by mouth  daily.   [DISCONTINUED] traZODone (DESYREL) 50 MG tablet TAKE 0.5-1 TABLETS (25-50 MG TOTAL) BY MOUTH AT BEDTIME AS NEEDED FOR SLEEP.   Social History   Tobacco Use   Smoking status: Never   Smokeless tobacco: Never  Substance Use Topics   Alcohol use: Not Currently    Alcohol/week: 0.0 standard drinks   Family History  Problem Relation Age of Onset   Diabetes Mother    Myasthenia gravis Mother    Other Father        DVT   Hypertension Father    Pulmonary embolism Father        x2   Diabetes Maternal Aunt    Hypertension Maternal Grandmother    Cancer Maternal Grandmother        MGGM-Uterine cancer   Arthritis Neg Hx        family hx   Colon cancer Neg Hx    Colon polyps Neg Hx     Esophageal cancer Neg Hx    Rectal cancer Neg Hx    Stomach cancer Neg Hx      Review of Systems  Constitutional:  Negative for activity change, appetite change, chills, fatigue, fever and unexpected weight change.  HENT:  Negative for congestion, ear pain, hearing loss, sinus pressure, sinus pain, sore throat and trouble swallowing.   Eyes:  Negative for pain and visual disturbance.  Respiratory:  Negative for cough, chest tightness, shortness of breath and wheezing.   Cardiovascular:  Negative for chest pain, palpitations and leg swelling.  Gastrointestinal:  Negative for abdominal pain, blood in stool, constipation, diarrhea, nausea and vomiting.  Genitourinary:  Negative for difficulty urinating and menstrual problem.  Musculoskeletal:  Negative for arthralgias and back pain.  Skin:  Negative for rash.  Neurological:  Negative for dizziness, weakness, numbness and headaches.  Hematological:  Negative for adenopathy. Does not bruise/bleed easily.  Psychiatric/Behavioral:  Negative for sleep disturbance and suicidal ideas. The patient is not nervous/anxious.    CBC:  Lab Results  Component Value Date   WBC 7.6 04/03/2020   HGB 11.9 (L) 04/03/2020   HCT 35.4 (L) 04/03/2020   MCHC 33.7 04/03/2020   RDW 14.7 04/03/2020   PLT 356.0 04/03/2020   CMP: Lab Results  Component Value Date   NA 142 04/03/2020   K 3.2 (L) 04/03/2020   CL 103 04/03/2020   CO2 33 (H) 04/03/2020   GLUCOSE 90 04/03/2020   BUN 13 04/03/2020   CREATININE 0.51 04/03/2020   CREATININE 0.48 (L) 01/01/2020   CALCIUM 9.6 04/03/2020   PROT 8.0 04/03/2020   BILITOT 0.4 04/03/2020   ALKPHOS 119 (H) 04/03/2020   ALT 9 04/03/2020   AST 12 04/03/2020   LIPID: Lab Results  Component Value Date   CHOL 182 09/05/2019   TRIG 66.0 09/05/2019   HDL 70.60 09/05/2019   LDLCALC 98 09/05/2019    Objective:  BP 108/80 (BP Location: Left Arm, Patient Position: Sitting, Cuff Size: Large)   Pulse 95   Temp 99.5  F (37.5 C) (Axillary)   Ht 5' 3.75" (1.619 m)   Wt 208 lb 3.2 oz (94.4 kg)   SpO2 98%   BMI 36.02 kg/m   Weight: 208 lb 3.2 oz (94.4 kg)   BP Readings from Last 3 Encounters:  10/20/20 108/80  04/03/20 118/80  01/01/20 126/80   Wt Readings from Last 3 Encounters:  10/20/20 208 lb 3.2 oz (94.4 kg)  04/03/20 203 lb 1.6 oz (92.1 kg)  01/01/20 200  lb (90.7 kg)    Physical Exam Constitutional:      General: She is not in acute distress.    Appearance: She is well-developed.  HENT:     Head: Normocephalic and atraumatic.     Right Ear: External ear normal.     Left Ear: External ear normal.     Mouth/Throat:     Pharynx: No oropharyngeal exudate.  Eyes:     Conjunctiva/sclera: Conjunctivae normal.     Pupils: Pupils are equal, round, and reactive to light.  Neck:     Thyroid: No thyromegaly.  Cardiovascular:     Rate and Rhythm: Normal rate and regular rhythm.     Heart sounds: Normal heart sounds. No murmur heard.   No friction rub. No gallop.  Pulmonary:     Effort: Pulmonary effort is normal.     Breath sounds: Normal breath sounds.  Abdominal:     General: Bowel sounds are normal. There is no distension.     Palpations: Abdomen is soft. There is no mass.     Tenderness: There is no abdominal tenderness. There is no guarding.     Hernia: No hernia is present.  Musculoskeletal:        General: No tenderness or deformity. Normal range of motion.     Cervical back: Normal range of motion and neck supple.  Feet:     Comments: Tenderness to palpation left plantar fascia heel. No other tenderness, edema noted on foot/ankle. Lymphadenopathy:     Cervical: No cervical adenopathy.  Skin:    General: Skin is warm and dry.     Findings: No rash.  Neurological:     Mental Status: She is alert and oriented to person, place, and time.     Deep Tendon Reflexes: Reflexes normal.     Reflex Scores:      Tricep reflexes are 2+ on the right side and 2+ on the left side.       Bicep reflexes are 2+ on the right side and 2+ on the left side.      Brachioradialis reflexes are 2+ on the right side and 2+ on the left side.      Patellar reflexes are 2+ on the right side and 2+ on the left side. Psychiatric:        Speech: Speech normal.        Behavior: Behavior normal.        Thought Content: Thought content normal.    Assessment/Plan: Health Maintenance Due  Topic Date Due   Pneumococcal Vaccine 57-40 Years old (1 - PCV) Never done   INFLUENZA VACCINE  10/06/2020    1. Preventative health care Get back to regular activity, keeping active. Work on cutting out unnecessary calories - fried foods, sweet tea. We reviewed healthier choices if getting fast food. We discussed meal planning and some ideas to help with healthier eating.   2. Essential hypertension Well controlled. Continue current medication.  - Comprehensive metabolic panel; Future - CBC with Differential/Platelet; Future - CBC with Differential/Platelet - Comprehensive metabolic panel  3. Fatigue, unspecified type - TSH; Future - Vitamin B12; Future - VITAMIN D 25 Hydroxy (Vit-D Deficiency, Fractures); Future - Ferritin; Future - Homocysteine; Future - Methylmalonic acid, serum; Future - Folate; Future - Folate - Methylmalonic acid, serum - Homocysteine - Ferritin - VITAMIN D 25 Hydroxy (Vit-D Deficiency, Fractures) - Vitamin B12 - TSH  4. Hypokalemia Continue with K supplementation.   5. Lipid screening - Lipid  panel; Future - Lipid panel  6. Screening for diabetes mellitus - Hemoglobin A1c; Future - Hemoglobin A1c  7. Plantar fasciitis of left foot Stretches, icing discussed. If not improved in 2 weeks time consider referral.   Return for pending bloodwork results.  Theodis Shove, MD

## 2020-10-22 LAB — METHYLMALONIC ACID, SERUM: Methylmalonic Acid, Quant: 103 nmol/L (ref 87–318)

## 2020-10-22 LAB — HOMOCYSTEINE: Homocysteine: 8.3 umol/L (ref <10.4)

## 2020-12-29 ENCOUNTER — Ambulatory Visit: Payer: 59 | Admitting: Nurse Practitioner

## 2021-01-01 ENCOUNTER — Ambulatory Visit (INDEPENDENT_AMBULATORY_CARE_PROVIDER_SITE_OTHER): Payer: 59 | Admitting: Nurse Practitioner

## 2021-01-01 ENCOUNTER — Other Ambulatory Visit: Payer: Self-pay

## 2021-01-01 ENCOUNTER — Other Ambulatory Visit (HOSPITAL_COMMUNITY)
Admission: RE | Admit: 2021-01-01 | Discharge: 2021-01-01 | Disposition: A | Discharge status: Home / Self Care | Payer: 59 | Source: Ambulatory Visit | Attending: Nurse Practitioner | Admitting: Nurse Practitioner

## 2021-01-01 ENCOUNTER — Encounter: Payer: Self-pay | Admitting: Nurse Practitioner

## 2021-01-01 VITALS — BP 124/82 | Ht 63.0 in | Wt 202.0 lb

## 2021-01-01 DIAGNOSIS — Z780 Asymptomatic menopausal state: Secondary | ICD-10-CM | POA: Diagnosis not present

## 2021-01-01 DIAGNOSIS — Z01419 Encounter for gynecological examination (general) (routine) without abnormal findings: Secondary | ICD-10-CM

## 2021-01-01 NOTE — Progress Notes (Signed)
   Tanya Richard May 27, 1965 [id number]   History:  55 y.o. G0 presents for annual exam without GYN complaints. Postmenopausal - no HRT, no bleeding. 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 Normal mammogram history.   Gynecologic History No LMP recorded. (Menstrual status: Postmenopausal).   Contraception: post menopausal status Sexually active: No  Health Maintenance Last Pap: 01/01/2020. Results were: Normal, 3-year repeat Last mammogram: 09/12/2020. Results were: Normal Last colonoscopy: 01/02/2019. Results were: normal, 10-year recall Last Dexa: Not indicated  Past medical history, past surgical history, family history and social history were all reviewed and documented in the EPIC chart. Single. Works for [employer].   ROS:  A ROS was performed and pertinent positives and negatives are included.  Exam:  Vitals:   01/01/21 1420  BP: 124/82  Weight: 202 lb (91.6 kg)  Height: 5\' 3"  (1.6 m)    Body mass index is 35.78 kg/m.  General appearance:  Normal Thyroid:  Symmetrical, normal in size, without palpable masses or nodularity. Respiratory  Auscultation:  Clear without wheezing or rhonchi Cardiovascular  Auscultation:  Regular rate, without rubs, murmurs or gallops  Edema/varicosities:  Not grossly evident Abdominal  Soft,nontender, without masses, guarding or rebound.  Liver/spleen:  No organomegaly noted  Hernia:  None appreciated  Skin  Inspection:  Grossly normal   Breasts: Examined lying and sitting.   Right: Without masses, retractions, discharge or axillary adenopathy.   Left: Without masses, retractions, discharge or axillary adenopathy. Genitourinary   Inguinal/mons:  Normal without inguinal adenopathy  External genitalia:  Normal appearing vulva with no masses, tenderness, or lesions  BUS/Urethra/Skene's glands:  Normal  Vagina:  Normal appearing with normal color and discharge, no lesions  Cervix:  Normal appearing without discharge or lesions  Uterus:   Normal in size, shape and contour.  Midline and mobile, nontender  Adnexa/parametria:     Rt: Normal in size, without masses or tenderness.   Lt: Normal in size, without masses or tenderness.  Anus and perineum: Normal  Digital rectal exam: Normal sphincter tone without palpated masses or tenderness  Patient informed chaperone available to be present for breast and pelvic exam. Patient has requested no chaperone to be present. Patient has been advised what will be completed during breast and pelvic exam.   Assessment/Plan:  55 y.o. G0 for annual exam.   Well female exam with routine gynecological exam - Plan: Cytology - PAP( [place]). Education provided on SBEs, importance of preventative screenings, current guidelines, high calcium diet, regular exercise, and multivitamin daily. Labs with PCP.   Postmenopausal - no HRT, no bleeding.   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 Requests annual paps.   Screening for breast cancer - Normal mammogram history. Normal breast exam today. Continue annual screenings.   Screening for colon cancer - normal colonoscopy in 2020. Will repeat in 10 years for GI's recommendation.  Follow up in 1 year for annual.       2021 [facility], 2:32 PM 01/01/2021

## 2021-01-02 ENCOUNTER — Other Ambulatory Visit: Payer: Self-pay | Admitting: Nurse Practitioner

## 2021-01-02 DIAGNOSIS — B3731 Acute candidiasis of vulva and vagina: Secondary | ICD-10-CM

## 2021-01-02 LAB — CYTOLOGY - PAP: Diagnosis: NEGATIVE

## 2021-01-02 MED ORDER — FLUCONAZOLE 150 MG PO TABS: 150.0000 mg | ORAL_TABLET | Freq: Once | ORAL | 0 refills | Status: AC

## 2021-01-09 ENCOUNTER — Ambulatory Visit (INDEPENDENT_AMBULATORY_CARE_PROVIDER_SITE_OTHER): Payer: 59 | Admitting: Family Medicine

## 2021-01-09 ENCOUNTER — Other Ambulatory Visit: Payer: Self-pay

## 2021-01-09 VITALS — BP 140/80 | HR 88 | Temp 98.7°F | Wt 202.4 lb

## 2021-01-09 DIAGNOSIS — R2241 Localized swelling, mass and lump, right lower limb: Secondary | ICD-10-CM | POA: Diagnosis not present

## 2021-01-09 NOTE — Progress Notes (Signed)
Subjective:    Patient ID: Tanya Richard, female    DOB: 20-Jun-1965, 55 y.o.   MRN: [id number]  Chief Complaint  Patient presents with   Mass    Lump on right hip, noticed it yesterday     HPI Patient was seen today for acute concern.  Patient noticed a lump underneath skin on the right lateral hip/flank yesterday while in the shower.  Patient states the area was sore yesterday but not today.  Patient denies bumping into anything or other injury.  Area is without erythema, pruritus, drainage.  Past Medical History:  Diagnosis Date   Allergy    Anxiety    COVID related anxiety - situational   ELEVATED BP READING WITHOUT DX HYPERTENSION 10/14/2009   GERD (gastroesophageal reflux disease)    Occ Pepcid AC depending on diet    Hypertension    Irregular heart beat    benign- no issues    NEPHROLITHIASIS, HX OF 12/21/2007    Allergies  Allergen Reactions   Lisinopril Swelling    Angioedema of lip see ed visit 101 6    ROS General: Denies fever, chills, night sweats, changes in weight, changes in appetite HEENT: Denies headaches, ear pain, changes in vision, rhinorrhea, sore throat CV: Denies CP, palpitations, SOB, orthopnea Pulm: Denies SOB, cough, wheezing GI: Denies abdominal pain, nausea, vomiting, diarrhea, constipation GU: Denies dysuria, hematuria, frequency, vaginal discharge Msk: Denies muscle cramps, joint pains Neuro: Denies weakness, numbness, tingling Skin: Denies rashes, bruising  +bump on side Psych: Denies depression, anxiety, hallucinations      Objective:    Blood pressure 140/80, pulse 88, temperature 98.7 F (37.1 C), temperature source Oral, weight 202 lb 6.4 oz (91.8 kg), SpO2 99 %.  Gen. Pleasant, well-nourished, in no distress, normal affect   HEENT: [place]/AT, face symmetric, conjunctiva clear, no scleral icterus, PERRLA, EOMI, nares patent without drainage Lungs: no accessory muscle use Cardiovascular: RRR, no peripheral edema Musculoskeletal: No  deformities, no cyanosis or clubbing, normal tone Neuro:  A&Ox3, CN II-XII intact, normal gait Skin:  Warm, dry, intact.  Right posterior lateral flank, superior to right hip with a 1 cm x 0.5 cm firm, smooth, oblong, mobile mass within the subcutaneous fat layer.  No erythema, ecchymosis, punctum, or drainage noted in area.   Wt Readings from Last 3 Encounters:  01/09/21 202 lb 6.4 oz (91.8 kg)  01/01/21 202 lb (91.6 kg)  10/20/20 208 lb 3.2 oz (94.4 kg)    Lab Results  Component Value Date   WBC 9.4 10/20/2020   HGB 12.1 10/20/2020   HCT 37.2 10/20/2020   PLT 367.0 10/20/2020   GLUCOSE 85 10/20/2020   CHOL 201 (H) 10/20/2020   TRIG 87.0 10/20/2020   HDL 76.90 10/20/2020   LDLCALC 107 (H) 10/20/2020   ALT 10 10/20/2020   AST 13 10/20/2020   NA 140 10/20/2020   K 3.2 (L) 10/20/2020   CL 99 10/20/2020   CREATININE 0.47 10/20/2020   BUN 13 10/20/2020   CO2 27 10/20/2020   TSH 0.98 10/20/2020   HGBA1C 6.0 10/20/2020    Assessment/Plan:  Mass of hip region, right -Oblong in shape like a grain of rice -Discussed possible causes including cyst versus lipoma or fibroma, though firmer than expected for lipoma. -Discussed treatment options including observation versus removal.  Patient wishes to observe area at this time. -Patient to follow up if area becomes larger, painful, erythematous, drains, etc.  F/u as needed  Abbe Amsterdam, MD

## 2021-01-13 ENCOUNTER — Encounter: Payer: Self-pay | Admitting: Family Medicine

## 2021-03-30 ENCOUNTER — Other Ambulatory Visit: Payer: Self-pay | Admitting: Family Medicine

## 2021-03-30 DIAGNOSIS — I10 Essential (primary) hypertension: Secondary | ICD-10-CM

## 2021-07-10 ENCOUNTER — Encounter: Payer: Self-pay | Admitting: Family Medicine

## 2021-07-10 ENCOUNTER — Ambulatory Visit (INDEPENDENT_AMBULATORY_CARE_PROVIDER_SITE_OTHER): Payer: 59 | Admitting: Family Medicine

## 2021-07-10 VITALS — BP 116/80 | HR 80 | Temp 98.4°F | Ht 63.0 in | Wt 209.6 lb

## 2021-07-10 DIAGNOSIS — Z13220 Encounter for screening for lipoid disorders: Secondary | ICD-10-CM | POA: Diagnosis not present

## 2021-07-10 DIAGNOSIS — R350 Frequency of micturition: Secondary | ICD-10-CM

## 2021-07-10 DIAGNOSIS — R5383 Other fatigue: Secondary | ICD-10-CM

## 2021-07-10 DIAGNOSIS — R7301 Impaired fasting glucose: Secondary | ICD-10-CM

## 2021-07-10 DIAGNOSIS — Z6837 Body mass index (BMI) 37.0-37.9, adult: Secondary | ICD-10-CM

## 2021-07-10 DIAGNOSIS — R002 Palpitations: Secondary | ICD-10-CM | POA: Diagnosis not present

## 2021-07-10 DIAGNOSIS — I10 Essential (primary) hypertension: Secondary | ICD-10-CM | POA: Diagnosis not present

## 2021-07-10 DIAGNOSIS — R7303 Prediabetes: Secondary | ICD-10-CM | POA: Insufficient documentation

## 2021-07-10 LAB — CBC WITH DIFFERENTIAL/PLATELET
Basophils Absolute: 0 10*3/uL (ref 0.0–0.1)
Basophils Relative: 0.5 % (ref 0.0–3.0)
Eosinophils Absolute: 0.1 10*3/uL (ref 0.0–0.7)
Eosinophils Relative: 1 % (ref 0.0–5.0)
HCT: 36.2 % (ref 36.0–46.0)
Hemoglobin: 12.1 g/dL (ref 12.0–15.0)
Lymphocytes Relative: 35.4 % (ref 12.0–46.0)
Lymphs Abs: 2.6 10*3/uL (ref 0.7–4.0)
MCHC: 33.5 g/dL (ref 30.0–36.0)
MCV: 85.7 fl (ref 78.0–100.0)
Monocytes Absolute: 0.3 10*3/uL (ref 0.1–1.0)
Monocytes Relative: 4.5 % (ref 3.0–12.0)
Neutro Abs: 4.3 10*3/uL (ref 1.4–7.7)
Neutrophils Relative %: 58.6 % (ref 43.0–77.0)
Platelets: 323 10*3/uL (ref 150.0–400.0)
RBC: 4.22 Mil/uL (ref 3.87–5.11)
RDW: 14.6 % (ref 11.5–15.5)
WBC: 7.2 10*3/uL (ref 4.0–10.5)

## 2021-07-10 LAB — HEMOGLOBIN A1C: Hgb A1c MFr Bld: 6.1 % (ref 4.6–6.5)

## 2021-07-10 LAB — IBC + FERRITIN
Ferritin: 57.4 ng/mL (ref 10.0–291.0)
Iron: 59 ug/dL (ref 42–145)
Saturation Ratios: 17.7 % — ABNORMAL LOW (ref 20.0–50.0)
TIBC: 333.2 ug/dL (ref 250.0–450.0)
Transferrin: 238 mg/dL (ref 212.0–360.0)

## 2021-07-10 LAB — COMPREHENSIVE METABOLIC PANEL
ALT: 12 U/L (ref 0–35)
AST: 16 U/L (ref 0–37)
Albumin: 3.9 g/dL (ref 3.5–5.2)
Alkaline Phosphatase: 112 U/L (ref 39–117)
BUN: 15 mg/dL (ref 6–23)
CO2: 32 mEq/L (ref 19–32)
Calcium: 9.6 mg/dL (ref 8.4–10.5)
Chloride: 102 mEq/L (ref 96–112)
Creatinine, Ser: 0.56 mg/dL (ref 0.40–1.20)
GFR: 104.13 mL/min (ref >60.00)
Glucose, Bld: 98 mg/dL (ref 70–99)
Potassium: 3.4 mEq/L — ABNORMAL LOW (ref 3.5–5.1)
Sodium: 142 mEq/L (ref 135–145)
Total Bilirubin: 0.4 mg/dL (ref 0.2–1.2)
Total Protein: 7.7 g/dL (ref 6.0–8.3)

## 2021-07-10 LAB — POC URINALSYSI DIPSTICK (AUTOMATED)
Bilirubin, UA: NEGATIVE
Glucose, UA: NEGATIVE
Ketones, UA: NEGATIVE
Leukocytes, UA: NEGATIVE
Nitrite, UA: NEGATIVE
Protein, UA: NEGATIVE
Spec Grav, UA: 1.02 (ref 1.010–1.025)
Urobilinogen, UA: 0.2 E.U./dL
pH, UA: 6.5 (ref 5.0–8.0)

## 2021-07-10 LAB — LIPID PANEL
Cholesterol: 168 mg/dL (ref 0–200)
HDL: 73 mg/dL (ref >39.00)
LDL Cholesterol: 83 mg/dL (ref 0–99)
NonHDL: 95.29
Total CHOL/HDL Ratio: 2
Triglycerides: 61 mg/dL (ref 0.0–149.0)
VLDL: 12.2 mg/dL (ref 0.0–40.0)

## 2021-07-10 LAB — VITAMIN B12: Vitamin B-12: 612 pg/mL (ref 211–911)

## 2021-07-10 LAB — TSH: TSH: 1.01 u[IU]/mL (ref 0.35–5.50)

## 2021-07-10 LAB — VITAMIN D 25 HYDROXY (VIT D DEFICIENCY, FRACTURES): VITD: 35.2 ng/mL (ref 30.00–100.00)

## 2021-07-10 MED ORDER — POTASSIUM CHLORIDE CRYS ER 10 MEQ PO TBCR: 20.0000 meq | EXTENDED_RELEASE_TABLET | Freq: Every day | ORAL | 1 refills | Status: DC

## 2021-07-10 MED ORDER — AMLODIPINE BESYLATE 5 MG PO TABS: 5.0000 mg | ORAL_TABLET | Freq: Every day | ORAL | 1 refills | Status: DC

## 2021-07-10 MED ORDER — HYDROCHLOROTHIAZIDE 25 MG PO TABS: 25.0000 mg | ORAL_TABLET | Freq: Every day | ORAL | 1 refills | Status: DC

## 2021-07-10 NOTE — Addendum Note (Signed)
Addended by: Elita Boone E on: 07/10/2021 03:30 PM ? ? Modules accepted: Orders ? ?

## 2021-07-10 NOTE — Addendum Note (Signed)
Addended by: Agnes Lawrence on: 07/10/2021 09:55 AM ? ? Modules accepted: Orders ? ?

## 2021-07-10 NOTE — Addendum Note (Signed)
Addended by: Marian Sorrow D on: 07/10/2021 03:21 PM ? ? Modules accepted: Orders ? ?

## 2021-07-10 NOTE — Progress Notes (Signed)
?Kare Dado ?DOB: 07/07/1965 ?Encounter date: 07/10/2021 ? ?This is a 56 y.o. female who presents with ?Chief Complaint  ?Patient presents with  ? Palpitations  ?  Recurrent x4 months  ? Urinary Frequency  ?  X6-8 weeks, denies pain or other symptoms  ? ? ?History of present illness: ?Last visit with me was 10/20/2020 for complete physical. ? ?In march when she called she was having severe pain in side, but this has resolved itself - feels it was muscular because it went away.  ? ?She is getting less flutters than she was in the past, but still wondering if she should see cardiology. Definitely gets more on days when she is under stress. No pain, no shortness of breath. Just wanted to make final decision about this.  ? ?Has been urinating a lot. Urge hits her strong. One night she went 7 times. Not sure if something else affecting this - like sugar or issue with urine. Not noted increased thirst. Has gained weight rather than lose. She has broken bone in left foot. Has limited her mobility. She has been compliant with boot for 6 weeks, but it hurts when she is up and moving. They are planning on casting her.  ? ? ? ?Allergies  ?Allergen Reactions  ? Lisinopril Swelling  ?  Angioedema of lip see ed visit 101 6  ? ?Current Meds  ?Medication Sig  ? Calcium Carb-Cholecalciferol (CALCIUM 1000 + D) 1000-800 MG-UNIT TABS   ? clindamycin (CLEOCIN T) 1 % external solution clindamycin phosphate 1 % topical solution ? APPLY TO AFFECTED AREAS UNDERNEATH BREASTS AND IN GROIN DAILY.  ? doxycycline (VIBRAMYCIN) 100 MG capsule Take by mouth 2 (two) times daily.  ? famotidine (PEPCID) 10 MG tablet Take 10 mg by mouth daily.   ? Ferrous Sulfate (IRON PO) Take 65 mg by mouth daily.  ? fluticasone (FLONASE) 50 MCG/ACT nasal spray Place 1 spray into both nostrils daily.  ? loratadine (CLARITIN) 10 MG tablet Take 10 mg by mouth daily as needed.  ? LUTEIN PO Take by mouth daily.  ? Multiple Vitamin (MULTIVITAMIN) capsule Take 1 capsule  by mouth daily.  ? Omega-3 Fatty Acids (FISH OIL PO) Take by mouth.  ? TURMERIC PO Take by mouth daily.  ? vitamin C (ASCORBIC ACID) 500 MG tablet Take 500 mg by mouth daily.  ? VITAMIN D PO Take by mouth daily at 6 (six) AM.  ? VITAMIN E PO Take 400 Units by mouth daily.  ? [DISCONTINUED] amLODipine (NORVASC) 5 MG tablet TAKE 1 TABLET BY MOUTH EVERY DAY  ? [DISCONTINUED] clonazePAM (KLONOPIN) 0.5 MG tablet Take 0.5-1 tablets (0.25-0.5 mg total) by mouth 2 (two) times daily as needed for anxiety.  ? [DISCONTINUED] hydrochlorothiazide (HYDRODIURIL) 25 MG tablet TAKE 1 TABLET BY MOUTH EVERY DAY  ? [DISCONTINUED] KLOR-CON M10 10 MEQ tablet TAKE 2 TABLETS BY MOUTH TWICE A DAY FOR 2 DAYS, THEN TAKE 1 TABLET DAILY (Patient taking differently: Take 20 mEq by mouth daily.)  ? [DISCONTINUED] nortriptyline (PAMELOR) 25 MG capsule TAKE 2 CAPSULES (50 MG TOTAL) BY MOUTH AT BEDTIME.  ? ? ?Review of Systems  ?Constitutional:  Negative for chills, fatigue and fever.  ?Respiratory:  Negative for cough, chest tightness, shortness of breath and wheezing.   ?Cardiovascular:  Negative for chest pain, palpitations and leg swelling.  ?Genitourinary:  Positive for frequency. Negative for difficulty urinating, dysuria and flank pain.  ? ?Objective: ? ?BP 116/80 (BP Location: Left Arm, Patient Position: Sitting, Cuff  Size: Large)   Pulse 80   Temp 98.4 ?F (36.9 ?C) (Oral)   Ht 5\' 3"  (1.6 m)   Wt 209 lb 9.6 oz (95.1 kg)   SpO2 98%   BMI 37.13 kg/m?   Weight: 209 lb 9.6 oz (95.1 kg)  ? ?BP Readings from Last 3 Encounters:  ?07/10/21 116/80  ?01/09/21 140/80  ?01/01/21 124/82  ? ?Wt Readings from Last 3 Encounters:  ?07/10/21 209 lb 9.6 oz (95.1 kg)  ?01/09/21 202 lb 6.4 oz (91.8 kg)  ?01/01/21 202 lb (91.6 kg)  ? ? ?Physical Exam ?Constitutional:   ?   General: She is not in acute distress. ?   Appearance: She is well-developed.  ?Cardiovascular:  ?   Rate and Rhythm: Normal rate and regular rhythm.  ?   Heart sounds: Normal heart  sounds. No murmur heard. ?  No friction rub.  ?Pulmonary:  ?   Effort: Pulmonary effort is normal. No respiratory distress.  ?   Breath sounds: Normal breath sounds. No wheezing or rales.  ?Abdominal:  ?   General: Abdomen is protuberant. Bowel sounds are normal.  ?   Palpations: Abdomen is soft.  ?   Tenderness: There is no abdominal tenderness. There is no right CVA tenderness or left CVA tenderness.  ?Musculoskeletal:  ?   Right lower leg: No edema.  ?   Left lower leg: No edema.  ?Neurological:  ?   Mental Status: She is alert and oriented to person, place, and time.  ?Psychiatric:     ?   Behavior: Behavior normal.  ? ? ?Assessment/Plan ? ?1. Essential hypertension ?Well controlled. Continue with current medications.  ?- CBC with Differential/Platelet; Future ?- Comprehensive metabolic panel; Future ?- amLODipine (NORVASC) 5 MG tablet; Take 1 tablet (5 mg total) by mouth daily.  Dispense: 90 tablet; Refill: 1 ?- hydrochlorothiazide (HYDRODIURIL) 25 MG tablet; Take 1 tablet (25 mg total) by mouth daily.  Dispense: 90 tablet; Refill: 1 ? ?2. Urinary frequency ?Will check labs, further consideration pending results. ?- Urinalysis; Future ?- Urine Culture; Future ? ?3. Palpitations ?Stable. No need for additional intervention. Pac/pvc on monitor last year, echo last year stable.  ?- TSH; Future ?- Vitamin B12; Future ? ?4. Impaired fasting glucose ?- Hemoglobin A1c; Future ? ?5. Lipid screening ?- Lipid panel; Future ? ?6. Fatigue, unspecified type ?- VITAMIN D 25 Hydroxy (Vit-D Deficiency, Fractures); Future ?- IBC + Ferritin; Future ? ? ?Return for pending lab or imaging results. ? ? ? ? ?01/03/21, MD ?

## 2021-07-11 LAB — URINALYSIS
Bilirubin Urine: NEGATIVE
Glucose, UA: NEGATIVE
Ketones, ur: NEGATIVE
Leukocytes,Ua: NEGATIVE
Nitrite: NEGATIVE
Protein, ur: NEGATIVE
Specific Gravity, Urine: 1.018 (ref 1.001–1.035)
pH: 6.5 (ref 5.0–8.0)

## 2021-07-11 LAB — URINE CULTURE
MICRO NUMBER:: 13357726
SPECIMEN QUALITY:: ADEQUATE

## 2021-08-28 ENCOUNTER — Other Ambulatory Visit: Payer: Self-pay | Admitting: Internal Medicine

## 2021-08-28 ENCOUNTER — Other Ambulatory Visit: Payer: Self-pay | Admitting: Pediatric Radiology

## 2021-08-28 DIAGNOSIS — Z1231 Encounter for screening mammogram for malignant neoplasm of breast: Secondary | ICD-10-CM

## 2021-09-18 ENCOUNTER — Ambulatory Visit
Admission: RE | Admit: 2021-09-18 | Discharge: 2021-09-18 | Disposition: A | Discharge status: Home / Self Care | Payer: 59 | Source: Ambulatory Visit | Attending: Internal Medicine | Admitting: Internal Medicine

## 2021-09-18 DIAGNOSIS — Z1231 Encounter for screening mammogram for malignant neoplasm of breast: Secondary | ICD-10-CM

## 2021-11-12 ENCOUNTER — Encounter: Payer: Self-pay | Admitting: Family Medicine

## 2021-11-12 ENCOUNTER — Ambulatory Visit (INDEPENDENT_AMBULATORY_CARE_PROVIDER_SITE_OTHER): Payer: 59 | Admitting: Family Medicine

## 2021-11-12 VITALS — BP 132/98 | HR 90 | Temp 99.0°F | Ht 63.0 in | Wt 211.4 lb

## 2021-11-12 DIAGNOSIS — M79672 Pain in left foot: Secondary | ICD-10-CM

## 2021-11-12 DIAGNOSIS — M79671 Pain in right foot: Secondary | ICD-10-CM | POA: Diagnosis not present

## 2021-11-12 DIAGNOSIS — M7631 Iliotibial band syndrome, right leg: Secondary | ICD-10-CM | POA: Diagnosis not present

## 2021-11-12 DIAGNOSIS — I10 Essential (primary) hypertension: Secondary | ICD-10-CM

## 2021-11-12 MED ORDER — POTASSIUM CHLORIDE CRYS ER 10 MEQ PO TBCR: 20.0000 meq | EXTENDED_RELEASE_TABLET | Freq: Every day | ORAL | 0 refills | Status: DC

## 2021-11-12 NOTE — Progress Notes (Signed)
Subjective:    Patient ID: Tanya Richard, female    DOB: 11/02/1965, 56 y.o.   MRN: [id number]  Chief Complaint  Patient presents with   Leg Pain    Patient complains of right leg pain, Patient denies any known injury, x2 months     HPI Patient is a 56 year old female with PMH SIG for HTN, CIN-1, h/o depression, renal calculi, insomnia was previously seen by Dr. Hassan Rowan who presents today for ongoing concern.  Patient endorses right hip/thigh pain times several months.  Denies injury.  States sensation is a dull ache in the anterior right thigh.  Feels okay with sitting.  Endorses occasional weakness.  Patient states she is unable to exercise 2/2 ongoing issues with bilateral feet.  Left foot fracture since December 2022 that is slow to heal and right heel tendon injury times years.  At times she is in b/l boots.  Has custom orthotics.  Currently wearing ballet flats.  Past Medical History:  Diagnosis Date   Allergy    Anxiety    COVID related anxiety - situational   ELEVATED BP READING WITHOUT DX HYPERTENSION 10/14/2009   GERD (gastroesophageal reflux disease)    Occ Pepcid AC depending on diet    Hypertension    Irregular heart beat    benign- no issues    NEPHROLITHIASIS, HX OF 12/21/2007    Allergies  Allergen Reactions   Lisinopril Swelling    Angioedema of lip see ed visit 101 6    ROS General: Denies fever, chills, night sweats, changes in weight, changes in appetite HEENT: Denies headaches, ear pain, changes in vision, rhinorrhea, sore throat CV: Denies CP, palpitations, SOB, orthopnea Pulm: Denies SOB, cough, wheezing GI: Denies abdominal pain, nausea, vomiting, diarrhea, constipation GU: Denies dysuria, hematuria, frequency, vaginal discharge Msk: Denies muscle cramps, joint pains  + right lateral thigh, anterior thigh ache, occasional weakness in RLE, bilateral foot pain Neuro: Denies weakness, numbness, tingling Skin: Denies rashes, bruising Psych: Denies  depression, anxiety, hallucinations     Objective:    Blood pressure (!) 160/90, pulse 90, temperature 99 F (37.2 C), temperature source Oral, height 5\' 3"  (1.6 m), weight 211 lb 6.4 oz (95.9 kg), SpO2 98 %.  Gen. Pleasant, well-nourished, in no distress, normal affect   HEENT: [place]/AT, face symmetric, conjunctiva clear, no scleral icterus, PERRLA, EOMI, nares patent without drainage Lungs: no accessory muscle use Cardiovascular: RRR, no peripheral edema Musculoskeletal: TTP of right lateral hip, IT band.  Exam of LEs limited 2/2 pt resistance.  negative logroll, straight leg raise, FADIR, FABER.  no deformities, no cyanosis or clubbing, normal tone Neuro:  A&Ox3, CN II-XII intact, normal gait Skin:  Warm, no lesions/ rash   Wt Readings from Last 3 Encounters:  11/12/21 211 lb 6.4 oz (95.9 kg)  07/10/21 209 lb 9.6 oz (95.1 kg)  01/09/21 202 lb 6.4 oz (91.8 kg)    Lab Results  Component Value Date   WBC 7.2 07/10/2021   HGB 12.1 07/10/2021   HCT 36.2 07/10/2021   PLT 323.0 07/10/2021   GLUCOSE 98 07/10/2021   CHOL 168 07/10/2021   TRIG 61.0 07/10/2021   HDL 73.00 07/10/2021   LDLCALC 83 07/10/2021   ALT 12 07/10/2021   AST 16 07/10/2021   NA 142 07/10/2021   K 3.4 (L) 07/10/2021   CL 102 07/10/2021   CREATININE 0.56 07/10/2021   BUN 15 07/10/2021   CO2 32 07/10/2021   TSH 1.01 07/10/2021   HGBA1C 6.1 07/10/2021  Assessment/Plan:  Iliotibial band syndrome of right side -New issue  Essential hypertension -Uncontrolled - Plan: potassium chloride (KLOR-CON M10) 10 MEQ tablet  Pain in both feet -Chronic  Right lateral hip/thigh pain likely 2/2 IT band syndrome given change in gait 2/2 ongoing bilateral foot issues.  Patient advised to stretch, use topical analgesics, ice, Tylenol or NSAIDs as needed etc. to relieve IT band symptoms.  Continue wearing shoes with orthotics.  Avoid flat shoes with little to no arch support.  Consider chair exercises.  Given handouts  on stretching.  Continue follow-up with podiatry/Ortho.  Recheck BP.  On HCTZ 25 mg daily, Klor-Con refilled.  Advised to monitor BP at home.  Lifestyle modifications  F/u prn with new PCP  Abbe Amsterdam, MD

## 2021-12-11 ENCOUNTER — Encounter: Payer: Self-pay | Admitting: Family Medicine

## 2021-12-11 ENCOUNTER — Ambulatory Visit (INDEPENDENT_AMBULATORY_CARE_PROVIDER_SITE_OTHER): Payer: 59 | Admitting: Family Medicine

## 2021-12-11 VITALS — BP 118/68 | HR 70 | Temp 98.4°F | Ht 64.0 in | Wt 212.4 lb

## 2021-12-11 DIAGNOSIS — R7303 Prediabetes: Secondary | ICD-10-CM

## 2021-12-11 DIAGNOSIS — I10 Essential (primary) hypertension: Secondary | ICD-10-CM | POA: Diagnosis not present

## 2021-12-11 DIAGNOSIS — R7301 Impaired fasting glucose: Secondary | ICD-10-CM | POA: Diagnosis not present

## 2021-12-11 LAB — POCT GLYCOSYLATED HEMOGLOBIN (HGB A1C): Hemoglobin A1C: 6.1 % — AB (ref 4.0–5.6)

## 2021-12-11 NOTE — Progress Notes (Signed)
Established Patient Office Visit  Subjective   Patient ID: Tanya Richard, female    DOB: 04-May-1965  Age: 56 y.o. MRN: [id number]  Chief Complaint  Patient presents with   Establish Care    Patient is here for transition of care visit. Right hip pain is still present, has not improved. She reports some aching, has not taken the NSAIDS that were recommended at the previous visit. Reports that it does hurt to lay on the right hip. Comes and goes somewhat randomly.   Prediabetes-- A1C is 6.1, she reports she is trying to cut down on sugary beverages. States that she has trouble with sugar free beverages. We discussed low carb foods and handouts will be given at the end of the visit. She is not currently on medications for this.   HTN -- BP in office performed and is well controlled. She  reports no side effects to the medications, no chest pain, SOB, dizziness or headaches. She has a BP cuff at home and is checking BP regularly, reports they are in the normal range.       Current Outpatient Medications  Medication Instructions   amLODipine (NORVASC) 5 mg, Oral, Daily   ascorbic acid (VITAMIN C) 500 mg, Oral, Daily   Calcium Carb-Cholecalciferol (CALCIUM 1000 + D) 1000-800 MG-UNIT TABS No dose, route, or frequency recorded.   clindamycin (CLEOCIN T) 1 % external solution clindamycin phosphate 1 % topical solution  APPLY TO AFFECTED AREAS UNDERNEATH BREASTS AND IN GROIN DAILY.   doxycycline (VIBRAMYCIN) 100 MG capsule Oral, 2 times daily   famotidine (PEPCID) 10 mg, Oral, Daily   Ferrous Sulfate (IRON PO) 65 mg, Oral, Daily   fluticasone (FLONASE) 50 MCG/ACT nasal spray 1 spray, Each Nare, Daily   hydrochlorothiazide (HYDRODIURIL) 25 mg, Oral, Daily   loratadine (CLARITIN) 10 mg, Oral, Daily PRN   LUTEIN PO Oral, Daily   Multiple Vitamin (MULTIVITAMIN) capsule 1 capsule, Oral, Daily,     Omega-3 Fatty Acids (FISH OIL PO) Oral   potassium chloride (KLOR-CON M10) 10 MEQ tablet 20 mEq,  Oral, Daily   TURMERIC PO Oral, Daily   VITAMIN D PO Oral, Daily   VITAMIN E PO 400 Units, Oral, Daily     Patient Active Problem List   Diagnosis Date Noted   Prediabetes 07/10/2021   Chronic insomnia 06/11/2019   Nonintractable headache 06/11/2019   Pain in thoracic spine 03/06/2019   Obesity 08/10/2013   CIN I (cervical intraepithelial neoplasia I)    Essential hypertension 72/53/6644   RENAL COLIC 03/47/4259   NEPHROLITHIASIS, HX OF 12/21/2007   DEPRESSION 10/19/2006      Review of Systems  All other systems reviewed and are negative.     Objective:     BP 118/68 (BP Location: Left Arm, Patient Position: Sitting, Cuff Size: Large)   Pulse 70   Temp 98.4 F (36.9 C) (Oral)   Ht 5\' 4"  (1.626 m)   Wt 212 lb 6.4 oz (96.3 kg)   SpO2 98%   BMI 36.46 kg/m    Physical Exam Vitals reviewed.  Constitutional:      Appearance: Normal appearance. She is well-groomed. She is obese.  Eyes:     Conjunctiva/sclera: Conjunctivae normal.  Neck:     Thyroid: No thyromegaly.  Cardiovascular:     Rate and Rhythm: Normal rate and regular rhythm.     Pulses: Normal pulses.     Heart sounds: S1 normal and S2 normal.  Pulmonary:  Effort: Pulmonary effort is normal.     Breath sounds: Normal breath sounds and air entry.  Abdominal:     General: Bowel sounds are normal.  Musculoskeletal:        General: Normal range of motion.     Right lower leg: No edema.     Left lower leg: No edema.  Neurological:     Mental Status: She is alert and oriented to person, place, and time. Mental status is at baseline.     Gait: Gait is intact.  Psychiatric:        Mood and Affect: Mood and affect normal.        Speech: Speech normal.        Behavior: Behavior normal.        Judgment: Judgment normal.      Results for orders placed or performed in visit on 12/11/21  POC HgB A1c  Result Value Ref Range   Hemoglobin A1C 6.1 (A) 4.0 - 5.6 %   HbA1c POC (<> result, manual entry)      HbA1c, POC (prediabetic range)     HbA1c, POC (controlled diabetic range)        The 10-year ASCVD risk score (Arnett DK, et al., 2019) is: 2%    Assessment & Plan:   Problem List Items Addressed This Visit       Cardiovascular and Mediastinum   Essential hypertension    Current hypertension medications:       Sig   amLODipine (NORVASC) 5 MG tablet (Taking) Take 1 tablet (5 mg total) by mouth daily.   hydrochlorothiazide (HYDRODIURIL) 25 MG tablet (Taking) Take 1 tablet (25 mg total) by mouth daily.  BP is well controlled today on the medications listed above, will continue these as prescribed.         Other   Prediabetes - Primary    A1C today is 6.1 which is relatively stable from previous. I had a long discussion about reducing sugar and starches in the diet, Handouts of low carb foods were given to the patient. RTC in 7 months for her annual physical, will repeat A1C then.       Return in about 7 months (around 07/12/2022) for Annual physical exam.    Farrel Conners, MD

## 2021-12-14 NOTE — Assessment & Plan Note (Signed)
Current hypertension medications:      Sig   amLODipine (NORVASC) 5 MG tablet (Taking) Take 1 tablet (5 mg total) by mouth daily.   hydrochlorothiazide (HYDRODIURIL) 25 MG tablet (Taking) Take 1 tablet (25 mg total) by mouth daily.     BP is well controlled today on the medications listed above, will continue these as prescribed.

## 2021-12-14 NOTE — Assessment & Plan Note (Signed)
A1C today is 6.1 which is relatively stable from previous. I had a long discussion about reducing sugar and starches in the diet, Handouts of low carb foods were given to the patient. RTC in 7 months for her annual physical, will repeat A1C then.

## 2021-12-27 IMAGING — DX DG CHEST 2V
2 series · 2 of 2 positions shown · non-contrast
Comparison: Chest radiograph 12/25/2014

CLINICAL DATA: Patient has chest pressure, palpations, and upper
back pain.

EXAM:
CHEST - 2 VIEW

[chest pa]
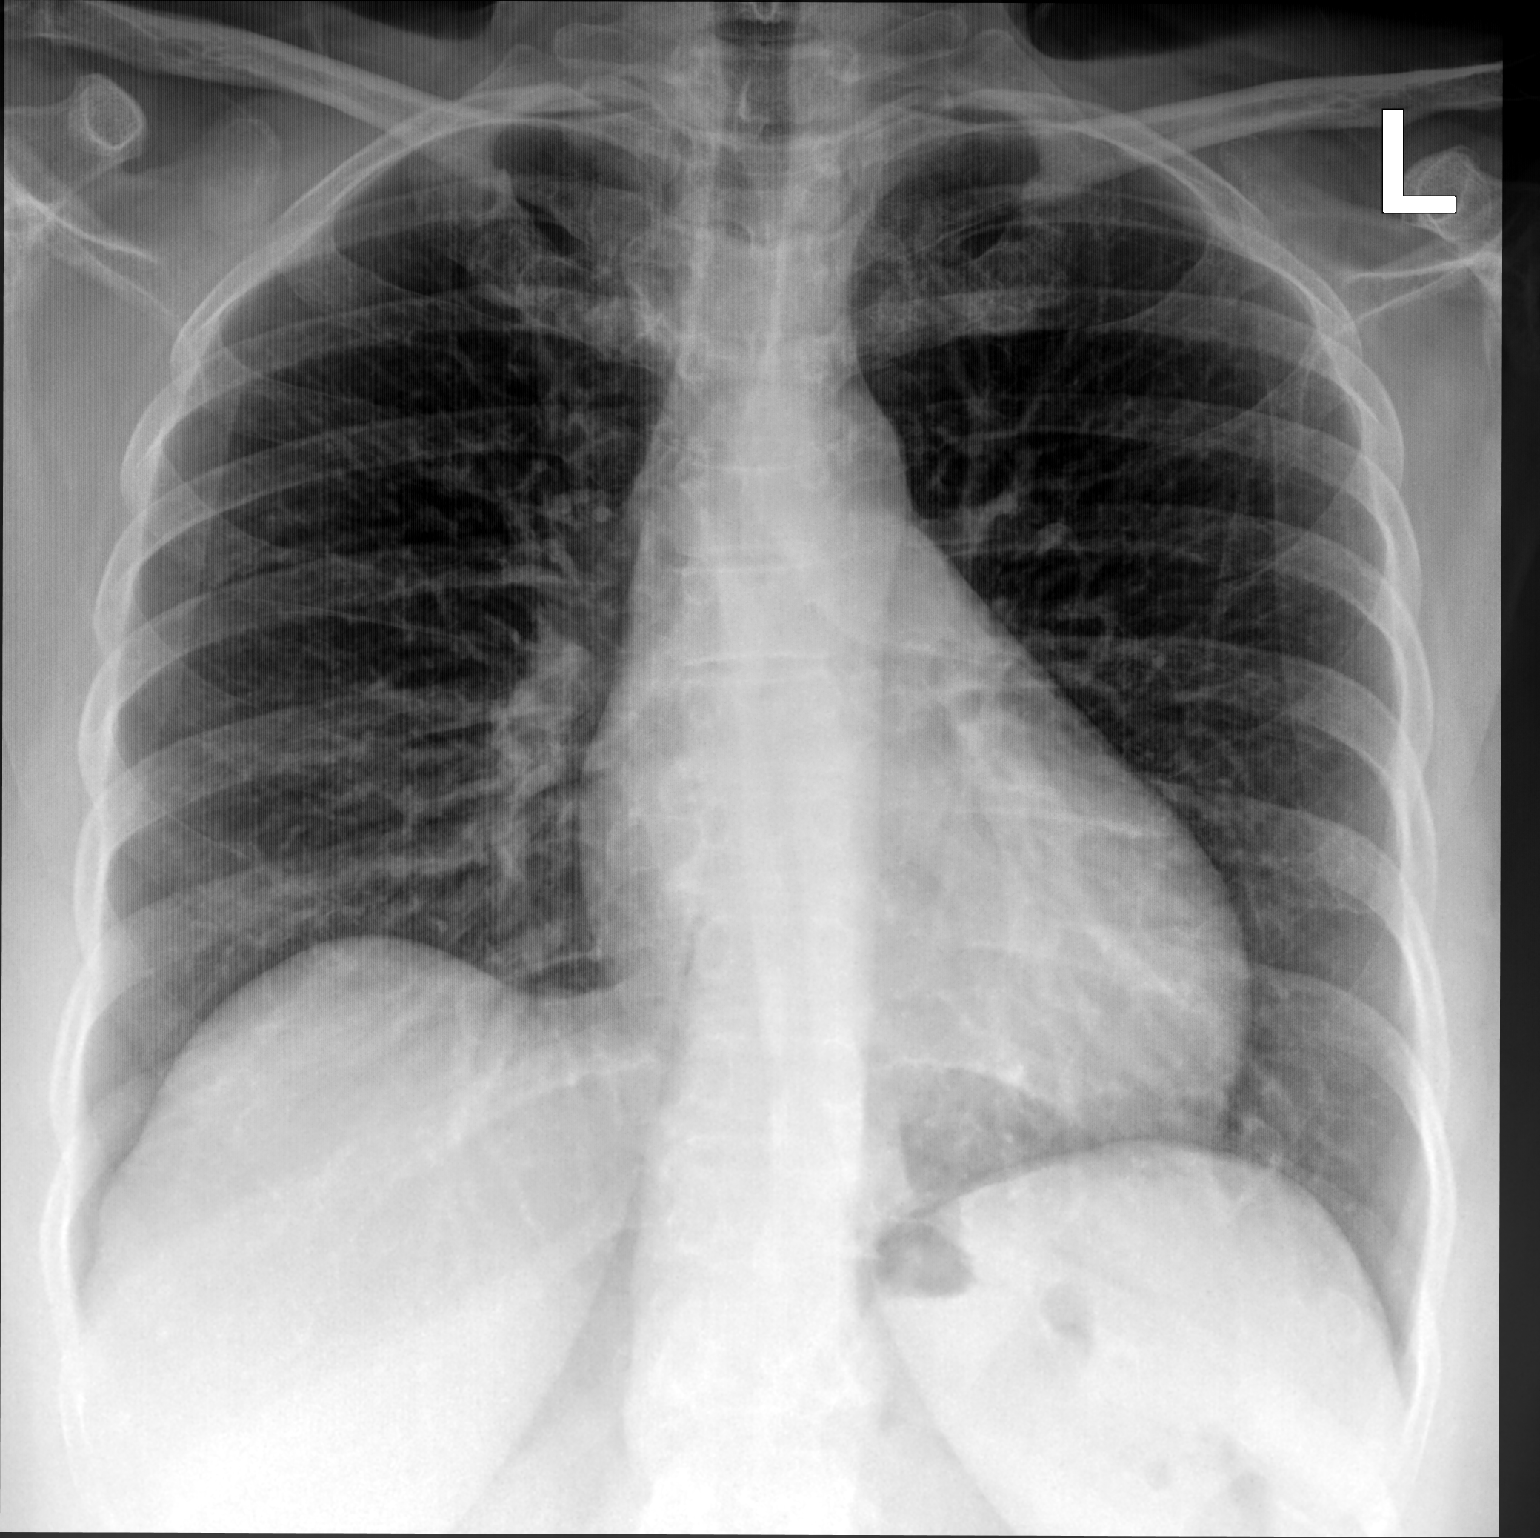

[chest lat]
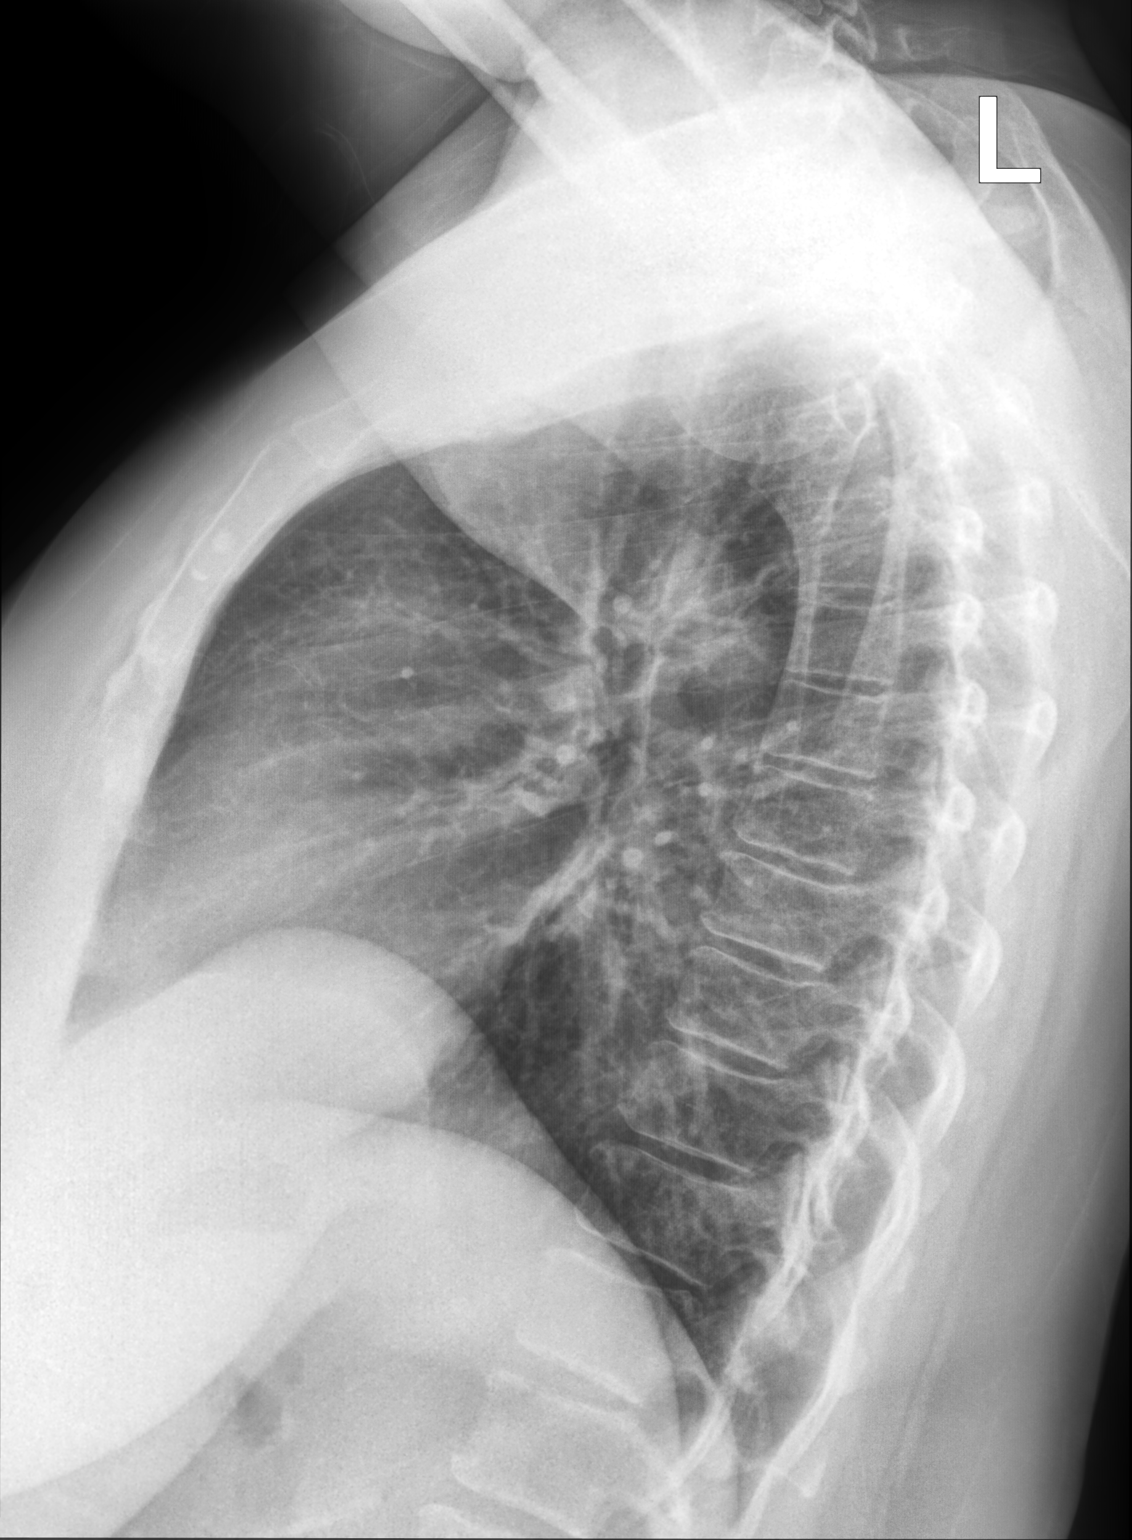

[2 of 2 positions shown; findings below may reference images not displayed]

FINDINGS: The cardiomediastinal contours are within normal limits. The lungs
are clear. No pneumothorax or pleural effusion. No acute finding in
the visualized skeleton.
IMPRESSION: No acute cardiopulmonary process.

## 2021-12-28 ENCOUNTER — Ambulatory Visit: Payer: 59 | Admitting: Nurse Practitioner

## 2022-01-01 ENCOUNTER — Encounter: Payer: Self-pay | Admitting: Family Medicine

## 2022-01-04 ENCOUNTER — Ambulatory Visit (INDEPENDENT_AMBULATORY_CARE_PROVIDER_SITE_OTHER): Payer: 59 | Admitting: Nurse Practitioner

## 2022-01-04 ENCOUNTER — Other Ambulatory Visit (HOSPITAL_COMMUNITY)
Admission: RE | Admit: 2022-01-04 | Discharge: 2022-01-04 | Disposition: A | Discharge status: Home / Self Care | Payer: 59 | Source: Ambulatory Visit | Attending: Nurse Practitioner | Admitting: Nurse Practitioner

## 2022-01-04 ENCOUNTER — Encounter: Payer: Self-pay | Admitting: Nurse Practitioner

## 2022-01-04 VITALS — BP 102/62 | HR 89 | Ht 63.75 in | Wt 212.0 lb

## 2022-01-04 DIAGNOSIS — Z01419 Encounter for gynecological examination (general) (routine) without abnormal findings: Secondary | ICD-10-CM | POA: Diagnosis not present

## 2022-01-04 DIAGNOSIS — Z780 Asymptomatic menopausal state: Secondary | ICD-10-CM

## 2022-01-04 NOTE — Progress Notes (Signed)
   Everlyn Farabaugh Nov 25, 1965 [id number]   History:  56 y.o. G0 presents for annual exam without GYN complaints. Postmenopausal - no HRT, no bleeding. 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 Normal mammogram history. HTN managed by PCP.   Gynecologic History No LMP recorded. (Menstrual status: Postmenopausal).   Contraception: post menopausal status Sexually active: No  Health Maintenance Last Pap: 01/01/2021. Results were: Normal Last mammogram: 09/18/2021. Results were: Normal Last colonoscopy: 01/02/2019. Results were: Normal, 10-year recall Last Dexa: Not indicated  Past medical history, past surgical history, family history and social history were all reviewed and documented in the EPIC chart. Single. Works for [employer].   ROS:  A ROS was performed and pertinent positives and negatives are included.  Exam:  Vitals:   01/04/22 1445  BP: 102/62  Pulse: 89  SpO2: 96%  Weight: 212 lb (96.2 kg)  Height: 5' 3.75" (1.619 m)     Body mass index is 36.68 kg/m.  General appearance:  Normal Thyroid:  Symmetrical, normal in size, without palpable masses or nodularity. Respiratory  Auscultation:  Clear without wheezing or rhonchi Cardiovascular  Auscultation:  Regular rate, without rubs, murmurs or gallops  Edema/varicosities:  Not grossly evident Abdominal  Soft,nontender, without masses, guarding or rebound.  Liver/spleen:  No organomegaly noted  Hernia:  None appreciated  Skin  Inspection:  Grossly normal   Breasts: Examined lying and sitting.   Right: Without masses, retractions, discharge or axillary adenopathy.   Left: Without masses, retractions, discharge or axillary adenopathy. Genitourinary   Inguinal/mons:  Normal without inguinal adenopathy  External genitalia:  Normal appearing vulva with no masses, tenderness, or lesions  BUS/Urethra/Skene's glands:  Normal  Vagina:  Normal appearing with normal color and discharge, no lesions  Cervix:  Normal appearing  without discharge or lesions  Uterus:  Normal in size, shape and contour.  Midline and mobile, nontender  Adnexa/parametria:     Rt: Normal in size, without masses or tenderness.   Lt: Normal in size, without masses or tenderness.  Anus and perineum: Normal  Digital rectal exam: Normal sphincter tone without palpated masses or tenderness  Patient informed chaperone available to be present for breast and pelvic exam. Patient has requested no chaperone to be present. Patient has been advised what will be completed during breast and pelvic exam.   Assessment/Plan:  56 y.o. G0 for annual exam.   Well female exam with routine gynecological exam - Plan: Cytology - PAP( [facility]). Education provided on SBEs, importance of preventative screenings, current guidelines, high calcium diet, regular exercise, and multivitamin daily. Labs with PCP.   Postmenopausal - no HRT, no bleeding  Screening for cervical cancer - 1986 CIN-1 with cryo, subsequent paps normal. Discussed current guidelines. Requests annual paps.   Screening for breast cancer - Normal mammogram history. Normal breast exam today. Continue annual screenings.   Screening for colon cancer - Normal colonoscopy in 2020. Will repeat in 10 years for GI's recommendation.  Follow up in 1 year for annual.       Tamela Gammon [facility], 3:07 PM 01/04/2022

## 2022-01-06 LAB — CYTOLOGY - PAP
Adequacy: ABSENT
Diagnosis: NEGATIVE

## 2022-03-30 ENCOUNTER — Other Ambulatory Visit: Payer: Self-pay | Admitting: *Deleted

## 2022-03-30 DIAGNOSIS — I10 Essential (primary) hypertension: Secondary | ICD-10-CM

## 2022-03-31 MED ORDER — HYDROCHLOROTHIAZIDE 25 MG PO TABS: 25.0000 mg | ORAL_TABLET | Freq: Every day | ORAL | 1 refills | Status: DC

## 2022-08-24 ENCOUNTER — Other Ambulatory Visit: Payer: Self-pay | Admitting: Family Medicine

## 2022-08-24 DIAGNOSIS — Z1231 Encounter for screening mammogram for malignant neoplasm of breast: Secondary | ICD-10-CM

## 2022-09-16 ENCOUNTER — Encounter: Payer: Self-pay | Admitting: Family Medicine

## 2022-09-16 ENCOUNTER — Ambulatory Visit (INDEPENDENT_AMBULATORY_CARE_PROVIDER_SITE_OTHER): Payer: 59 | Admitting: Family Medicine

## 2022-09-16 VITALS — BP 130/78 | HR 80 | Temp 98.2°F | Ht 63.75 in | Wt 208.6 lb

## 2022-09-16 DIAGNOSIS — I10 Essential (primary) hypertension: Secondary | ICD-10-CM | POA: Diagnosis not present

## 2022-09-16 DIAGNOSIS — Z13220 Encounter for screening for lipoid disorders: Secondary | ICD-10-CM | POA: Diagnosis not present

## 2022-09-16 DIAGNOSIS — R7303 Prediabetes: Secondary | ICD-10-CM | POA: Diagnosis not present

## 2022-09-16 DIAGNOSIS — Z0000 Encounter for general adult medical examination without abnormal findings: Secondary | ICD-10-CM | POA: Diagnosis not present

## 2022-09-16 LAB — CBC WITH DIFFERENTIAL/PLATELET
Basophils Absolute: 0.1 10*3/uL (ref 0.0–0.1)
Basophils Relative: 0.7 % (ref 0.0–3.0)
Eosinophils Absolute: 0.1 10*3/uL (ref 0.0–0.7)
Eosinophils Relative: 0.8 % (ref 0.0–5.0)
HCT: 35.6 % — ABNORMAL LOW (ref 36.0–46.0)
Hemoglobin: 11.6 g/dL — ABNORMAL LOW (ref 12.0–15.0)
Lymphocytes Relative: 35 % (ref 12.0–46.0)
Lymphs Abs: 3.2 10*3/uL (ref 0.7–4.0)
MCHC: 32.6 g/dL (ref 30.0–36.0)
MCV: 85.3 fl (ref 78.0–100.0)
Monocytes Absolute: 0.4 10*3/uL (ref 0.1–1.0)
Monocytes Relative: 4.5 % (ref 3.0–12.0)
Neutro Abs: 5.4 10*3/uL (ref 1.4–7.7)
Neutrophils Relative %: 59 % (ref 43.0–77.0)
Platelets: 364 10*3/uL (ref 150.0–400.0)
RBC: 4.18 Mil/uL (ref 3.87–5.11)
RDW: 15.4 % (ref 11.5–15.5)
WBC: 9.2 10*3/uL (ref 4.0–10.5)

## 2022-09-16 LAB — COMPREHENSIVE METABOLIC PANEL
ALT: 11 U/L (ref 0–35)
AST: 11 U/L (ref 0–37)
Albumin: 3.8 g/dL (ref 3.5–5.2)
Alkaline Phosphatase: 114 U/L (ref 39–117)
BUN: 16 mg/dL (ref 6–23)
CO2: 32 mEq/L (ref 19–32)
Calcium: 9.6 mg/dL (ref 8.4–10.5)
Chloride: 101 mEq/L (ref 96–112)
Creatinine, Ser: 0.56 mg/dL (ref 0.40–1.20)
GFR: 103.27 mL/min (ref >60.00)
Glucose, Bld: 104 mg/dL — ABNORMAL HIGH (ref 70–99)
Potassium: 2.9 mEq/L — ABNORMAL LOW (ref 3.5–5.1)
Sodium: 141 mEq/L (ref 135–145)
Total Bilirubin: 0.4 mg/dL (ref 0.2–1.2)
Total Protein: 7.6 g/dL (ref 6.0–8.3)

## 2022-09-16 LAB — LIPID PANEL
Cholesterol: 171 mg/dL (ref 0–200)
HDL: 59.6 mg/dL (ref >39.00)
LDL Cholesterol: 95 mg/dL (ref 0–99)
NonHDL: 111.46
Total CHOL/HDL Ratio: 3
Triglycerides: 83 mg/dL (ref 0.0–149.0)
VLDL: 16.6 mg/dL (ref 0.0–40.0)

## 2022-09-16 LAB — HEMOGLOBIN A1C: Hgb A1c MFr Bld: 6.3 % (ref 4.6–6.5)

## 2022-09-16 MED ORDER — AMLODIPINE BESYLATE 5 MG PO TABS: 5.0000 mg | ORAL_TABLET | Freq: Every day | ORAL | 1 refills | Status: DC

## 2022-09-16 MED ORDER — HYDROCHLOROTHIAZIDE 25 MG PO TABS: 25.0000 mg | ORAL_TABLET | Freq: Every day | ORAL | 1 refills | Status: DC

## 2022-09-16 NOTE — Patient Instructions (Addendum)
Florastor probiotic to help reduce gass Health Maintenance, Female Adopting a healthy lifestyle and getting preventive care are important in promoting health and wellness. Ask your health care provider about: The right schedule for you to have regular tests and exams. Things you can do on your own to prevent diseases and keep yourself healthy. What should I know about diet, weight, and exercise? Eat a healthy diet  Eat a diet that includes plenty of vegetables, fruits, low-fat dairy products, and lean protein. Do not eat a lot of foods that are high in solid fats, added sugars, or sodium. Maintain a healthy weight Body mass index (BMI) is used to identify weight problems. It estimates body fat based on height and weight. Your health care provider can help determine your BMI and help you achieve or maintain a healthy weight. Get regular exercise Get regular exercise. This is one of the most important things you can do for your health. Most adults should: Exercise for at least 150 minutes each week. The exercise should increase your heart rate and make you sweat (moderate-intensity exercise). Do strengthening exercises at least twice a week. This is in addition to the moderate-intensity exercise. Spend less time sitting. Even light physical activity can be beneficial. Watch cholesterol and blood lipids Have your blood tested for lipids and cholesterol at 55 years of age, then have this test every 5 years. Have your cholesterol levels checked more often if: Your lipid or cholesterol levels are high. You are older than 55 years of age. You are at high risk for heart disease. What should I know about cancer screening? Depending on your health history and family history, you may need to have cancer screening at various ages. This may include screening for: Breast cancer. Cervical cancer. Colorectal cancer. Skin cancer. Lung cancer. What should I know about heart disease, diabetes, and high  blood pressure? Blood pressure and heart disease High blood pressure causes heart disease and increases the risk of stroke. This is more likely to develop in people who have high blood pressure readings or are overweight. Have your blood pressure checked: Every 3-5 years if you are 61-57 years of age. Every year if you are 26 years old or older. Diabetes Have regular diabetes screenings. This checks your fasting blood sugar level. Have the screening done: Once every three years after age 74 if you are at a normal weight and have a low risk for diabetes. More often and at a younger age if you are overweight or have a high risk for diabetes. What should I know about preventing infection? Hepatitis B If you have a higher risk for hepatitis B, you should be screened for this virus. Talk with your health care provider to find out if you are at risk for hepatitis B infection. Hepatitis C Testing is recommended for: Everyone born from [birth year] through 1963. Anyone with known risk factors for hepatitis C. Sexually transmitted infections (STIs) Get screened for STIs, including gonorrhea and chlamydia, if: You are sexually active and are younger than 55 years of age. You are older than 55 years of age and your health care provider tells you that you are at risk for this type of infection. Your sexual activity has changed since you were last screened, and you are at increased risk for chlamydia or gonorrhea. Ask your health care provider if you are at risk. Ask your health care provider about whether you are at high risk for HIV. Your health care provider may recommend a  prescription medicine to help prevent HIV infection. If you choose to take medicine to prevent HIV, you should first get tested for HIV. You should then be tested every 3 months for as long as you are taking the medicine. Pregnancy If you are about to stop having your period (premenopausal) and you may become pregnant, seek counseling  before you get pregnant. Take 400 to 800 micrograms (mcg) of folic acid every day if you become pregnant. Ask for birth control (contraception) if you want to prevent pregnancy. Osteoporosis and menopause Osteoporosis is a disease in which the bones lose minerals and strength with aging. This can result in bone fractures. If you are 20 years old or older, or if you are at risk for osteoporosis and fractures, ask your health care provider if you should: Be screened for bone loss. Take a calcium or vitamin D supplement to lower your risk of fractures. Be given hormone replacement therapy (HRT) to treat symptoms of menopause. Follow these instructions at home: Alcohol use Do not drink alcohol if: Your health care provider tells you not to drink. You are pregnant, may be pregnant, or are planning to become pregnant. If you drink alcohol: Limit how much you have to: 0-1 drink a day. Know how much alcohol is in your drink. In the [place] one drink equals one 12 oz bottle of beer (355 mL), one 5 oz glass of wine (148 mL), or one 1 oz glass of hard liquor (44 mL). Lifestyle Do not use any products that contain nicotine or tobacco. These products include cigarettes, chewing tobacco, and vaping devices, such as e-cigarettes. If you need help quitting, ask your health care provider. Do not use street drugs. Do not share needles. Ask your health care provider for help if you need support or information about quitting drugs. General instructions Schedule regular health, dental, and eye exams. Stay current with your vaccines. Tell your health care provider if: You often feel depressed. You have ever been abused or do not feel safe at home. Summary Adopting a healthy lifestyle and getting preventive care are important in promoting health and wellness. Follow your health care provider's instructions about healthy diet, exercising, and getting tested or screened for diseases. Follow your health care  provider's instructions on monitoring your cholesterol and blood pressure. This information is not intended to replace advice given to you by your health care provider. Make sure you discuss any questions you have with your health care provider. Document Revised: 07/14/2020 Document Reviewed: 07/14/2020 Elsevier Patient Education  2024 [employer].

## 2022-09-16 NOTE — Progress Notes (Signed)
Complete physical exam  Patient: Tanya Richard   DOB: 13-May-1965   57 y.o. Female  MRN: [id number]  Subjective:    Chief Complaint  Patient presents with   Annual Exam   Results    Patient states she usually donates blood four times a year and was recently told she is unable to donate blood due to a low reading (not sure of actual test)    Tanya Richard is a 57 y.o. female who presents today for a complete physical exam. She reports consuming a general diet. Home exercise routine includes walking 0.5 hrs per days. She generally feels well. She reports sleeping pt states she sleeps about 4 hours per night, states this is chronic for her. She does not have additional problems to discuss today.    Most recent fall risk assessment:    11/12/2021    9:45 AM  Fall Risk   Falls in the past year? 0  Number falls in past yr: 0  Injury with Fall? 0  Risk for fall due to : No Fall Risks  Follow up Falls evaluation completed     Most recent depression screenings:    09/16/2022    8:20 AM 12/11/2021   12:59 PM  PHQ 2/9 Scores  PHQ - 2 Score 0 0  PHQ- 9 Score 2 2    Vision:Within last year and Dental: No current dental problems and Receives regular dental care  Patient Active Problem List   Diagnosis Date Noted   Prediabetes 07/10/2021   Chronic insomnia 06/11/2019   Nonintractable headache 06/11/2019   Pain in thoracic spine 03/06/2019   Obesity 08/10/2013   CIN I (cervical intraepithelial neoplasia I)    Essential hypertension 10/28/2009   RENAL COLIC 12/21/2007   NEPHROLITHIASIS, HX OF 12/21/2007   DEPRESSION 10/19/2006      Patient Care Team: Karie Georges, MD as PCP - General (Family Medicine) Harrington Challenger, NP (Inactive) (Obstetrics and Gynecology)   Outpatient Medications Prior to Visit  Medication Sig   Calcium Carb-Cholecalciferol (CALCIUM 1000 + D) 1000-800 MG-UNIT TABS    clindamycin (CLEOCIN T) 1 % external solution clindamycin phosphate 1 % topical  solution  APPLY TO AFFECTED AREAS UNDERNEATH BREASTS AND IN GROIN DAILY.   doxycycline (VIBRAMYCIN) 100 MG capsule Take by mouth 2 (two) times daily.   famotidine (PEPCID) 10 MG tablet Take 10 mg by mouth daily.    Ferrous Sulfate (IRON PO) Take 65 mg by mouth daily.   fluticasone (FLONASE) 50 MCG/ACT nasal spray Place 1 spray into both nostrils daily.   loratadine (CLARITIN) 10 MG tablet Take 10 mg by mouth daily as needed.   LUTEIN PO Take by mouth daily.   Multiple Vitamin (MULTIVITAMIN) capsule Take 1 capsule by mouth daily.   Omega-3 Fatty Acids (FISH OIL PO) Take by mouth.   potassium chloride (KLOR-CON M10) 10 MEQ tablet Take 2 tablets (20 mEq total) by mouth daily.   TURMERIC PO Take by mouth daily.   vitamin C (ASCORBIC ACID) 500 MG tablet Take 500 mg by mouth daily.   VITAMIN D PO Take by mouth daily at 6 (six) AM.   VITAMIN E PO Take 400 Units by mouth daily.   [DISCONTINUED] amLODipine (NORVASC) 5 MG tablet Take 1 tablet (5 mg total) by mouth daily.   [DISCONTINUED] hydrochlorothiazide (HYDRODIURIL) 25 MG tablet Take 1 tablet (25 mg total) by mouth daily.   No facility-administered medications prior to visit.    Review of Systems  HENT:  Negative for hearing loss.   Eyes:  Negative for blurred vision.  Respiratory:  Negative for shortness of breath.   Cardiovascular:  Negative for chest pain.  Gastrointestinal: Negative.   Genitourinary: Negative.   Musculoskeletal:  Negative for back pain.  Neurological:  Negative for headaches.  Psychiatric/Behavioral:  Negative for depression.   All other systems reviewed and are negative.      Objective:     BP 130/78 (BP Location: Left Arm, Patient Position: Sitting, Cuff Size: Large)   Pulse 80   Temp 98.2 F (36.8 C) (Oral)   Ht 5' 3.75" (1.619 m)   Wt 208 lb 9.6 oz (94.6 kg)   LMP 04/15/2018   SpO2 99%   BMI 36.09 kg/m  BP Readings from Last 3 Encounters:  09/16/22 130/78  01/04/22 102/62  12/11/21 118/68       Physical Exam Vitals reviewed.  Constitutional:      Appearance: Normal appearance. She is well-groomed. She is obese.  HENT:     Right Ear: Tympanic membrane normal.     Left Ear: Tympanic membrane normal.     Mouth/Throat:     Mouth: Mucous membranes are moist.     Pharynx: No posterior oropharyngeal erythema.  Eyes:     Conjunctiva/sclera: Conjunctivae normal.  Neck:     Thyroid: No thyromegaly.  Cardiovascular:     Rate and Rhythm: Normal rate and regular rhythm.     Pulses: Normal pulses.     Heart sounds: S1 normal and S2 normal.  Pulmonary:     Effort: Pulmonary effort is normal.     Breath sounds: Normal breath sounds and air entry.  Abdominal:     General: Bowel sounds are normal.  Musculoskeletal:     Right lower leg: No edema.     Left lower leg: No edema.  Lymphadenopathy:     Cervical: No cervical adenopathy.  Neurological:     Mental Status: She is alert and oriented to person, place, and time. Mental status is at baseline.     Gait: Gait is intact.  Psychiatric:        Mood and Affect: Mood and affect normal.        Speech: Speech normal.        Behavior: Behavior normal.        Judgment: Judgment normal.      No results found for any visits on 09/16/22.     Assessment & Plan:    Routine Health Maintenance and Physical Exam  Immunization History  Administered Date(s) Administered   Influenza,inj,Quad PF,6+ Mos 12/20/2016, 12/20/2017, 12/08/2018, 01/01/2020   Influenza-Unspecified 01/22/2015, 12/23/2015, 12/08/2018, 12/19/2020, 12/12/2021   PFIZER Comirnaty(Gray Top)Covid-19 Tri-Sucrose Vaccine 08/03/2020   PFIZER(Purple Top)SARS-COV-2 Vaccination 05/29/2019, 06/19/2019, 01/28/2020, 08/03/2020, 03/06/2022   Pfizer Covid-19 Vaccine Bivalent Booster 93yrs & up 01/16/2021, 09/26/2021   Tdap 11/13/2010, 11/13/2021   Zoster Recombinant(Shingrix) 09/05/2019, 01/11/2020    Health Maintenance  Topic Date Due   COVID-19 Vaccine (9 - 2023-24 season)  10/02/2022 (Originally 05/01/2022)   INFLUENZA VACCINE  10/07/2022   MAMMOGRAM  09/19/2023   PAP SMEAR-Modifier  01/04/2025   Colonoscopy  01/01/2029   DTaP/Tdap/Td (3 - Td or Tdap) 11/14/2031   Hepatitis C Screening  Completed   HIV Screening  Completed   Zoster Vaccines- Shingrix  Completed   HPV VACCINES  Aged Out    Discussed health benefits of physical activity, and encouraged her to engage in regular exercise appropriate for her age and  condition.  Essential hypertension -     CBC with Differential/Platelet; Future -     Comprehensive metabolic panel -     hydroCHLOROthiazide; Take 1 tablet (25 mg total) by mouth daily.  Dispense: 90 tablet; Refill: 1 -     amLODIPine Besylate; Take 1 tablet (5 mg total) by mouth daily.  Dispense: 90 tablet; Refill: 1  Prediabetes -     Hemoglobin A1c  Lipid screening -     Lipid panel; Future  Routine general medical examination at a health care facility  Normal physical exam findings today, I an ordering her annual labs for surveillance. We discussed reducing sugar and starches in her diet, we discussed OTC medications she could try to help with her sleep patterns, we discussed good sleep hygiene as well. Handouts given on healthy eating and exercise.   Return in about 6 months (around 03/19/2023) for prediabetes A1C.     Karie Georges, MD

## 2022-09-21 ENCOUNTER — Ambulatory Visit: Admission: RE | Admit: 2022-09-21 | Payer: 59 | Source: Ambulatory Visit

## 2022-09-21 DIAGNOSIS — Z1231 Encounter for screening mammogram for malignant neoplasm of breast: Secondary | ICD-10-CM

## 2022-09-21 MED ORDER — POTASSIUM CHLORIDE CRYS ER 20 MEQ PO TBCR
20.0000 meq | EXTENDED_RELEASE_TABLET | Freq: Two times a day (BID) | ORAL | 0 refills | Status: DC
Start: 2022-09-21 — End: 2022-10-13

## 2022-09-21 NOTE — Addendum Note (Signed)
Addended by: Karie Georges on: 09/21/2022 12:50 PM   Modules accepted: Orders

## 2022-10-13 ENCOUNTER — Other Ambulatory Visit: Payer: Self-pay | Admitting: Family Medicine

## 2022-10-13 DIAGNOSIS — I10 Essential (primary) hypertension: Secondary | ICD-10-CM

## 2022-12-24 ENCOUNTER — Ambulatory Visit: Payer: 59 | Admitting: Family Medicine

## 2023-01-31 ENCOUNTER — Encounter: Payer: Self-pay | Admitting: Family Medicine

## 2023-02-01 ENCOUNTER — Encounter: Payer: Self-pay | Admitting: Nurse Practitioner

## 2023-02-01 ENCOUNTER — Ambulatory Visit (INDEPENDENT_AMBULATORY_CARE_PROVIDER_SITE_OTHER): Payer: 59 | Admitting: Nurse Practitioner

## 2023-02-01 ENCOUNTER — Other Ambulatory Visit (HOSPITAL_COMMUNITY)
Admission: RE | Admit: 2023-02-01 | Discharge: 2023-02-01 | Disposition: A | Discharge status: Home / Self Care | Payer: 59 | Source: Ambulatory Visit | Attending: Nurse Practitioner | Admitting: Nurse Practitioner

## 2023-02-01 VITALS — BP 110/68 | HR 93 | Ht 63.25 in | Wt 205.0 lb

## 2023-02-01 DIAGNOSIS — Z01419 Encounter for gynecological examination (general) (routine) without abnormal findings: Secondary | ICD-10-CM

## 2023-02-01 DIAGNOSIS — Z780 Asymptomatic menopausal state: Secondary | ICD-10-CM | POA: Diagnosis not present

## 2023-02-01 DIAGNOSIS — B3731 Acute candidiasis of vulva and vagina: Secondary | ICD-10-CM | POA: Diagnosis not present

## 2023-02-01 DIAGNOSIS — Z124 Encounter for screening for malignant neoplasm of cervix: Secondary | ICD-10-CM | POA: Diagnosis present

## 2023-02-01 NOTE — Progress Notes (Signed)
   Tanya Richard [birth year] [id number]   History:  57 y.o. G0 presents for annual exam without GYN complaints. Postmenopausal - no HRT, no bleeding. 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 Normal mammogram history. HTN managed by PCP.   Gynecologic History No LMP recorded. (Menstrual status: Postmenopausal).   Contraception: post menopausal status Sexually active: No  Health Maintenance Last Pap: 01/04/2022. Results were: Normal Last mammogram: 09/21/2022. Results were: Normal Last colonoscopy: 01/02/2019. Results were: Normal, 10-year recall Last Dexa: Not indicated  Past medical history, past surgical history, family history and social history were all reviewed and documented in the EPIC chart. Single. Works for [employer].   ROS:  A ROS was performed and pertinent positives and negatives are included.  Exam:  Vitals:   02/01/23 1533  BP: 110/68  Pulse: 93  SpO2: 99%  Weight: 205 lb (93 kg)  Height: 5' 3.25" (1.607 m)      Body mass index is 36.03 kg/m.  General appearance:  Normal Thyroid:  Symmetrical, normal in size, without palpable masses or nodularity. Respiratory  Auscultation:  Clear without wheezing or rhonchi Cardiovascular  Auscultation:  Regular rate, without rubs, murmurs or gallops  Edema/varicosities:  Not grossly evident Abdominal  Soft,nontender, without masses, guarding or rebound.  Liver/spleen:  No organomegaly noted  Hernia:  None appreciated  Skin  Inspection:  Grossly normal   Breasts: Examined lying and sitting.   Right: Without masses, retractions, discharge or axillary adenopathy.   Left: Without masses, retractions, discharge or axillary adenopathy. Pelvic: External genitalia:  no lesions              Urethra:  normal appearing urethra with no masses, tenderness or lesions              Bartholins and Skenes: normal                 Vagina: normal appearing vagina with normal color and discharge, no lesions              Cervix: no  lesions Bimanual Exam:  Uterus:  no masses or tenderness              Adnexa: no mass, fullness, tenderness              Rectovaginal: Deferred              Anus:  normal, no lesions  Patient informed chaperone available to be present for breast and pelvic exam. Patient has requested no chaperone to be present. Patient has been advised what will be completed during breast and pelvic exam.   Assessment/Plan:  57 y.o. G0 for annual exam.   Well female exam with routine gynecological exam - Education provided on SBEs, importance of preventative screenings, current guidelines, high calcium diet, regular exercise, and multivitamin daily. Labs with PCP.   Postmenopausal - no HRT, no bleeding  Cervical cancer screening - Plan: Cytology - PAP( [place]). 1986 CIN-1 with cryo, subsequent paps normal. Discussed current guidelines. Requests annual paps.   Screening for breast cancer - Normal mammogram history. Normal breast exam today. Continue annual screenings.   Screening for colon cancer - Normal colonoscopy in 2020.   Return in about 1 year (around 02/01/2024) for Annual.      Olivia Mackie [facility], 4:07 PM 02/01/2023

## 2023-02-04 LAB — CYTOLOGY - PAP
Adequacy: ABSENT
Diagnosis: NEGATIVE

## 2023-02-07 MED ORDER — FLUCONAZOLE 150 MG PO TABS: 150.0000 mg | ORAL_TABLET | ORAL | 0 refills | Status: DC

## 2023-02-07 NOTE — Addendum Note (Signed)
Addended byWyline Beady on: 02/07/2023 08:38 AM   Modules accepted: Orders

## 2023-05-05 ENCOUNTER — Other Ambulatory Visit: Payer: Self-pay | Admitting: Family Medicine

## 2023-05-05 DIAGNOSIS — I10 Essential (primary) hypertension: Secondary | ICD-10-CM

## 2023-05-06 NOTE — Telephone Encounter (Signed)
 Pt needs a 6 month follow up visit to check her A1C and her potassium. Please call patient and have her schedule.

## 2023-05-09 NOTE — Telephone Encounter (Signed)
 Patient informed of the message below and stated she will call back to schedule a follow up visit.

## 2023-05-23 ENCOUNTER — Encounter: Payer: Self-pay | Admitting: Family Medicine

## 2023-05-23 ENCOUNTER — Ambulatory Visit (INDEPENDENT_AMBULATORY_CARE_PROVIDER_SITE_OTHER): Admitting: Family Medicine

## 2023-05-23 VITALS — BP 132/79 | HR 83 | Temp 98.0°F | Wt 204.5 lb

## 2023-05-23 DIAGNOSIS — R7303 Prediabetes: Secondary | ICD-10-CM | POA: Diagnosis not present

## 2023-05-23 DIAGNOSIS — S39012A Strain of muscle, fascia and tendon of lower back, initial encounter: Secondary | ICD-10-CM

## 2023-05-23 DIAGNOSIS — I10 Essential (primary) hypertension: Secondary | ICD-10-CM | POA: Diagnosis not present

## 2023-05-23 LAB — BASIC METABOLIC PANEL
BUN: 15 mg/dL (ref 6–23)
CO2: 28 meq/L (ref 19–32)
Calcium: 9.8 mg/dL (ref 8.4–10.5)
Chloride: 103 meq/L (ref 96–112)
Creatinine, Ser: 0.49 mg/dL (ref 0.40–1.20)
GFR: 106.14 mL/min (ref >60.00)
Glucose, Bld: 115 mg/dL — ABNORMAL HIGH (ref 70–99)
Potassium: 3.6 meq/L (ref 3.5–5.1)
Sodium: 141 meq/L (ref 135–145)

## 2023-05-23 LAB — POCT GLYCOSYLATED HEMOGLOBIN (HGB A1C): Hemoglobin A1C: 6.3 % — AB (ref 4.0–5.6)

## 2023-05-23 MED ORDER — CYCLOBENZAPRINE HCL 10 MG PO TABS
5.0000 mg | ORAL_TABLET | Freq: Every day | ORAL | 0 refills | Status: AC
Start: 2023-05-23 — End: 2023-06-02

## 2023-05-23 NOTE — Assessment & Plan Note (Signed)
 A1C today is 6.3 which is relatively stable from previous. I had a long discussion about reducing sugar and starches in the diet, Handouts of low carb foods were given to the patient. RTC in 6 months for her annual physical, will repeat A1C then.

## 2023-05-23 NOTE — Progress Notes (Unsigned)
 Established Patient Office Visit  Subjective   Patient ID: Tanya Richard, female    DOB: Aug 29, 1965  Age: 58 y.o. MRN: [id number]  Chief Complaint  Patient presents with   Abdominal Pain    Patient complains of left sided abdominal pain, x3 weeks, Tried Aleve    Pt is here for follow up today.  Pt reports she has having severe left sided back pain, states she bent over to pick up a baby and severely strained her back. States that it is a soreness that has not resolved. Worst is to lay down on the left side and turn over in bed. States that it happened about 3  weeks ago and it was unusual for it to last this long so she was concerned. States she only took aleve 1-2 times for it, has been icing it and using a heating pad.   HTN -- BP in office performed and is well controlled. She  reports no side effects to the medications, no chest pain, SOB, dizziness or headaches. She has a BP cuff at home and is checking BP regularly, reports they are in the normal range.   Prediabetes-- diet controlled only, not on any medication at this time. A1C performed in office today is 6.3, weight is stable from previous.       Current Outpatient Medications  Medication Instructions   amLODipine (NORVASC) 5 mg, Oral, Daily   ascorbic acid (VITAMIN C) 500 mg, Daily   Calcium Carb-Cholecalciferol (CALCIUM 1000 + D) 1000-800 MG-UNIT TABS No dose, route, or frequency recorded.   clindamycin (CLEOCIN T) 1 % external solution clindamycin phosphate 1 % topical solution  APPLY TO AFFECTED AREAS UNDERNEATH BREASTS AND IN GROIN DAILY.   cyclobenzaprine (FLEXERIL) 5-10 mg, Oral, Daily at bedtime   doxycycline (VIBRAMYCIN) 100 MG capsule 2 times daily   famotidine (PEPCID) 10 mg, Daily   Ferrous Sulfate (IRON PO) 65 mg, Daily   fluconazole (DIFLUCAN) 150 mg, Oral, Every 3 DAYS   fluticasone (FLONASE) 50 MCG/ACT nasal spray 1 spray, Daily   hydrochlorothiazide (HYDRODIURIL) 25 mg, Oral, Daily   loratadine  (CLARITIN) 10 mg, Daily PRN   LUTEIN PO Daily   Multiple Vitamin (MULTIVITAMIN) capsule 1 capsule, Daily   Omega-3 Fatty Acids (FISH OIL PO) Take by mouth.   potassium chloride SA (KLOR-CON M20) 20 MEQ tablet 20 mEq, Oral, 2 times daily   TURMERIC PO Daily   VITAMIN D PO Daily   VITAMIN E PO 400 Units, Daily    Patient Active Problem List   Diagnosis Date Noted   Prediabetes 07/10/2021   Chronic insomnia 06/11/2019   Nonintractable headache 06/11/2019   Pain in thoracic spine 03/06/2019   Obesity 08/10/2013   CIN I (cervical intraepithelial neoplasia I)    Essential hypertension 10/28/2009   RENAL COLIC 12/21/2007   NEPHROLITHIASIS, HX OF 12/21/2007   DEPRESSION 10/19/2006      Review of Systems  All other systems reviewed and are negative.     Objective:     BP 132/79 (BP Location: Right Arm, Patient Position: Sitting, Cuff Size: Large)   Pulse 83   Temp 98 F (36.7 C) (Oral)   Wt 204 lb 8 oz (92.8 kg)   LMP 04/15/2018   SpO2 95%   BMI 35.94 kg/m    Physical Exam Vitals reviewed.  Constitutional:      Appearance: Normal appearance. She is well-groomed. She is obese.  Cardiovascular:     Rate and Rhythm: Normal rate and  regular rhythm.     Heart sounds: S1 normal and S2 normal.  Pulmonary:     Effort: Pulmonary effort is normal.     Breath sounds: Normal breath sounds and air entry.  Abdominal:     General: Bowel sounds are normal.  Musculoskeletal:        General: Tenderness (to palpation of the muscles in the left lower back between the ribs and the iliac crest) present.  Neurological:     Mental Status: She is alert and oriented to person, place, and time. Mental status is at baseline.     Gait: Gait is intact.  Psychiatric:        Mood and Affect: Mood and affect normal.        Speech: Speech normal.        Behavior: Behavior normal.     Last hemoglobin A1c Lab Results  Component Value Date   HGBA1C 6.3 (A) 05/23/2023      The 10-year  ASCVD risk score (Arnett DK, et al., 2019) is: 4.4%    Assessment & Plan:  Prediabetes Assessment & Plan: A1C today is 6.3 which is relatively stable from previous. I had a long discussion about reducing sugar and starches in the diet, Handouts of low carb foods were given to the patient. RTC in 6 months for her annual physical, will repeat A1C then.  Orders: -     POCT glycosylated hemoglobin (Hb A1C) -     Collection capillary blood specimen  Essential hypertension Assessment & Plan: Current hypertension medications:       Sig   amLODipine (NORVASC) 5 MG tablet (Taking) TAKE 1 TABLET (5 MG TOTAL) BY MOUTH DAILY.   hydrochlorothiazide (HYDRODIURIL) 25 MG tablet (Taking) Take 1 tablet (25 mg total) by mouth daily.      BP is elevated today on the medications listed above, however I rechecked the BP and it improved. She is in pain today so it may be falsely elevated  will continue these as prescribed. She needs repeat BMP to check on the hypokalemia, she is currently on 20 MEQ BID of he potassium to help compensate.   Orders: -     Basic metabolic panel; Future  Back strain, initial encounter -     Cyclobenzaprine HCl; Take 0.5-1 tablets (5-10 mg total) by mouth at bedtime for 10 days.  Dispense: 10 tablet; Refill: 0   Acute problem, will treat with muscle relaxers and I encouraged her to continue NSAIDS OTC, if no improvement in the next couple of weeks ew could consider referral to physical therapy.   Return in about 6 months (around 11/23/2023) for annual physical exam.    Karie Georges, MD

## 2023-05-23 NOTE — Assessment & Plan Note (Signed)
 Current hypertension medications:       Sig   amLODipine (NORVASC) 5 MG tablet (Taking) TAKE 1 TABLET (5 MG TOTAL) BY MOUTH DAILY.   hydrochlorothiazide (HYDRODIURIL) 25 MG tablet (Taking) Take 1 tablet (25 mg total) by mouth daily.      BP is elevated today on the medications listed above, will continue these as prescribed. She needs repeat BMP to check on the hypokalemia, she is currently on 20 MEQ BID of he potassium to help compensate.

## 2023-05-23 NOTE — Patient Instructions (Addendum)
 Aleve twice a day for at least 4-5 days PLUS muscle relaxer at night.

## 2023-05-24 ENCOUNTER — Encounter: Payer: Self-pay | Admitting: Family Medicine

## 2023-08-24 ENCOUNTER — Other Ambulatory Visit: Payer: Self-pay | Admitting: Family Medicine

## 2023-08-24 DIAGNOSIS — Z1231 Encounter for screening mammogram for malignant neoplasm of breast: Secondary | ICD-10-CM

## 2023-08-31 ENCOUNTER — Ambulatory Visit: Admitting: Family Medicine

## 2023-09-22 ENCOUNTER — Ambulatory Visit
Admission: RE | Admit: 2023-09-22 | Discharge: 2023-09-22 | Disposition: A | Discharge status: Home / Self Care | Source: Ambulatory Visit | Attending: Family Medicine | Admitting: Family Medicine

## 2023-09-22 DIAGNOSIS — Z1231 Encounter for screening mammogram for malignant neoplasm of breast: Secondary | ICD-10-CM

## 2023-09-26 ENCOUNTER — Ambulatory Visit: Payer: Self-pay | Admitting: Family Medicine

## 2023-10-26 ENCOUNTER — Encounter: Admitting: Family Medicine

## 2023-10-29 ENCOUNTER — Other Ambulatory Visit: Payer: Self-pay | Admitting: Family Medicine

## 2023-10-29 DIAGNOSIS — I10 Essential (primary) hypertension: Secondary | ICD-10-CM

## 2023-11-15 ENCOUNTER — Ambulatory Visit: Payer: Self-pay | Admitting: Family Medicine

## 2023-11-15 ENCOUNTER — Other Ambulatory Visit: Payer: Self-pay

## 2023-11-15 ENCOUNTER — Ambulatory Visit (INDEPENDENT_AMBULATORY_CARE_PROVIDER_SITE_OTHER): Admitting: Family Medicine

## 2023-11-15 ENCOUNTER — Encounter: Payer: Self-pay | Admitting: Family Medicine

## 2023-11-15 VITALS — BP 110/80 | HR 80 | Temp 98.5°F | Ht 63.75 in | Wt 201.9 lb

## 2023-11-15 DIAGNOSIS — Z13220 Encounter for screening for lipoid disorders: Secondary | ICD-10-CM

## 2023-11-15 DIAGNOSIS — I10 Essential (primary) hypertension: Secondary | ICD-10-CM | POA: Diagnosis not present

## 2023-11-15 DIAGNOSIS — Z0000 Encounter for general adult medical examination without abnormal findings: Secondary | ICD-10-CM

## 2023-11-15 DIAGNOSIS — R7303 Prediabetes: Secondary | ICD-10-CM

## 2023-11-15 LAB — POCT GLYCOSYLATED HEMOGLOBIN (HGB A1C): Hemoglobin A1C: 6.1 % — AB (ref 4.0–5.6)

## 2023-11-15 LAB — COMPREHENSIVE METABOLIC PANEL WITH GFR
ALT: 13 U/L (ref 0–35)
AST: 14 U/L (ref 0–37)
Albumin: 3.9 g/dL (ref 3.5–5.2)
Alkaline Phosphatase: 106 U/L (ref 39–117)
BUN: 22 mg/dL (ref 6–23)
CO2: 30 meq/L (ref 19–32)
Calcium: 9.7 mg/dL (ref 8.4–10.5)
Chloride: 101 meq/L (ref 96–112)
Creatinine, Ser: 0.55 mg/dL (ref 0.40–1.20)
GFR: 102.87 mL/min (ref >60.00)
Glucose, Bld: 106 mg/dL — ABNORMAL HIGH (ref 70–99)
Potassium: 2.9 meq/L — ABNORMAL LOW (ref 3.5–5.1)
Sodium: 140 meq/L (ref 135–145)
Total Bilirubin: 0.4 mg/dL (ref 0.2–1.2)
Total Protein: 7.7 g/dL (ref 6.0–8.3)

## 2023-11-15 LAB — LIPID PANEL
Cholesterol: 173 mg/dL (ref 0–200)
HDL: 68.6 mg/dL (ref >39.00)
LDL Cholesterol: 91 mg/dL (ref 0–99)
NonHDL: 104.33
Total CHOL/HDL Ratio: 3
Triglycerides: 65 mg/dL (ref 0.0–149.0)
VLDL: 13 mg/dL (ref 0.0–40.0)

## 2023-11-15 MED ORDER — HYDROCHLOROTHIAZIDE 25 MG PO TABS
25.0000 mg | ORAL_TABLET | Freq: Every day | ORAL | 1 refills | Status: AC
Start: 2023-11-15 — End: ?

## 2023-11-15 MED ORDER — POTASSIUM CHLORIDE CRYS ER 20 MEQ PO TBCR
40.0000 meq | EXTENDED_RELEASE_TABLET | Freq: Every day | ORAL | 3 refills | Status: AC
Start: 2023-11-15 — End: ?

## 2023-11-15 NOTE — Addendum Note (Signed)
 Addended by: Adriano Bischof on: 11/15/2023 09:34 AM   Modules accepted: Orders

## 2023-11-15 NOTE — Patient Instructions (Addendum)
 Berberine 500-600 mg 30 minutes before each meal  Health Maintenance, Female Adopting a healthy lifestyle and getting preventive care are important in promoting health and wellness. Ask your health care provider about: The right schedule for you to have regular tests and exams. Things you can do on your own to prevent diseases and keep yourself healthy. What should I know about diet, weight, and exercise? Eat a healthy diet  Eat a diet that includes plenty of vegetables, fruits, low-fat dairy products, and lean protein. Do not eat a lot of foods that are high in solid fats, added sugars, or sodium. Maintain a healthy weight Body mass index (BMI) is used to identify weight problems. It estimates body fat based on height and weight. Your health care provider can help determine your BMI and help you achieve or maintain a healthy weight. Get regular exercise Get regular exercise. This is one of the most important things you can do for your health. Most adults should: Exercise for at least 150 minutes each week. The exercise should increase your heart rate and make you sweat (moderate-intensity exercise). Do strengthening exercises at least twice a week. This is in addition to the moderate-intensity exercise. Spend less time sitting. Even light physical activity can be beneficial. Watch cholesterol and blood lipids Have your blood tested for lipids and cholesterol at 56 years of age, then have this test every 5 years. Have your cholesterol levels checked more often if: Your lipid or cholesterol levels are high. You are older than 56 years of age. You are at high risk for heart disease. What should I know about cancer screening? Depending on your health history and family history, you may need to have cancer screening at various ages. This may include screening for: Breast cancer. Cervical cancer. Colorectal cancer. Skin cancer. Lung cancer. What should I know about heart disease, diabetes,  and high blood pressure? Blood pressure and heart disease High blood pressure causes heart disease and increases the risk of stroke. This is more likely to develop in people who have high blood pressure readings or are overweight. Have your blood pressure checked: Every 3-5 years if you are 38-47 years of age. Every year if you are 64 years old or older. Diabetes Have regular diabetes screenings. This checks your fasting blood sugar level. Have the screening done: Once every three years after age 49 if you are at a normal weight and have a low risk for diabetes. More often and at a younger age if you are overweight or have a high risk for diabetes. What should I know about preventing infection? Hepatitis B If you have a higher risk for hepatitis B, you should be screened for this virus. Talk with your health care provider to find out if you are at risk for hepatitis B infection. Hepatitis C Testing is recommended for: Everyone born from [birth year] through 1963. Anyone with known risk factors for hepatitis C. Sexually transmitted infections (STIs) Get screened for STIs, including gonorrhea and chlamydia, if: You are sexually active and are younger than 56 years of age. You are older than 56 years of age and your health care provider tells you that you are at risk for this type of infection. Your sexual activity has changed since you were last screened, and you are at increased risk for chlamydia or gonorrhea. Ask your health care provider if you are at risk. Ask your health care provider about whether you are at high risk for HIV. Your health care provider  may recommend a prescription medicine to help prevent HIV infection. If you choose to take medicine to prevent HIV, you should first get tested for HIV. You should then be tested every 3 months for as long as you are taking the medicine. Pregnancy If you are about to stop having your period (premenopausal) and you may become pregnant, seek  counseling before you get pregnant. Take 400 to 800 micrograms (mcg) of folic acid  every day if you become pregnant. Ask for birth control (contraception) if you want to prevent pregnancy. Osteoporosis and menopause Osteoporosis is a disease in which the bones lose minerals and strength with aging. This can result in bone fractures. If you are 53 years old or older, or if you are at risk for osteoporosis and fractures, ask your health care provider if you should: Be screened for bone loss. Take a calcium or vitamin D  supplement to lower your risk of fractures. Be given hormone replacement therapy (HRT) to treat symptoms of menopause. Follow these instructions at home: Alcohol use Do not drink alcohol if: Your health care provider tells you not to drink. You are pregnant, may be pregnant, or are planning to become pregnant. If you drink alcohol: Limit how much you have to: 0-1 drink a day. Know how much alcohol is in your drink. In the [place] one drink equals one 12 oz bottle of beer (355 mL), one 5 oz glass of wine (148 mL), or one 1 oz glass of hard liquor (44 mL). Lifestyle Do not use any products that contain nicotine or tobacco. These products include cigarettes, chewing tobacco, and vaping devices, such as e-cigarettes. If you need help quitting, ask your health care provider. Do not use street drugs. Do not share needles. Ask your health care provider for help if you need support or information about quitting drugs. General instructions Schedule regular health, dental, and eye exams. Stay current with your vaccines. Tell your health care provider if: You often feel depressed. You have ever been abused or do not feel safe at home. Summary Adopting a healthy lifestyle and getting preventive care are important in promoting health and wellness. Follow your health care provider's instructions about healthy diet, exercising, and getting tested or screened for diseases. Follow your  health care provider's instructions on monitoring your cholesterol and blood pressure. This information is not intended to replace advice given to you by your health care provider. Make sure you discuss any questions you have with your health care provider. Document Revised: 07/14/2020 Document Reviewed: 07/14/2020 Elsevier Patient Education  2024 [employer].

## 2023-11-15 NOTE — Addendum Note (Signed)
 Addended by: Freeda Spivey M on: 11/15/2023 09:22 AM   Modules accepted: Orders

## 2023-11-15 NOTE — Addendum Note (Signed)
 Addended by: Fredia Chittenden on: 11/15/2023 09:21 AM   Modules accepted: Orders

## 2023-11-15 NOTE — Progress Notes (Signed)
 Complete physical exam  Patient: Tanya Richard   DOB: Jun 04, 1965   58 y.o. Female  MRN: [id number]  Subjective:    Chief Complaint  Patient presents with   Annual Exam    Bridgit Eynon is a 58 y.o. female who presents today for a complete physical exam. She reports consuming a general and hasn't really been watching her carbs diet. Home exercise routine includes walking 1-2 hrs per week. Isn't exercising as much as she should. Has problems with right foot swelling/pain when she exercises. She generally feels fairly well. She reports sleeping fairly well. Reports she started taking magnesium at night which is helping her sleep a little longer.  She does not have additional problems to discuss today.    Most recent fall risk assessment:    02/01/2023    3:36 PM  Fall Risk   Falls in the past year? 0  Number falls in past yr: 0  Injury with Fall? 0  Risk for fall due to : No Fall Risks  Follow up Falls evaluation completed     Most recent depression screenings:    11/15/2023    7:57 AM 02/01/2023    3:37 PM  PHQ 2/9 Scores  PHQ - 2 Score 0 0  PHQ- 9 Score 0     Vision:Within last year and sees Dr. Octavia and Dental: No current dental problems and Receives regular dental care -- Dr. Joshua or Dr. Sheral  Patient Active Problem List   Diagnosis Date Noted   Prediabetes 07/10/2021   Chronic insomnia 06/11/2019   Nonintractable headache 06/11/2019   Pain in thoracic spine 03/06/2019   Obesity 08/10/2013   CIN I (cervical intraepithelial neoplasia I)    Essential hypertension 10/28/2009   RENAL COLIC 12/21/2007   NEPHROLITHIASIS, HX OF 12/21/2007   DEPRESSION 10/19/2006      Patient Care Team: Ozell Heron HERO, MD as PCP - General (Family Medicine)   Outpatient Medications Prior to Visit  Medication Sig   amLODipine  (NORVASC ) 5 MG tablet TAKE 1 TABLET (5 MG TOTAL) BY MOUTH DAILY.   Calcium Carb-Cholecalciferol (CALCIUM 1000 + D) 1000-800 MG-UNIT TABS     clindamycin (CLEOCIN T) 1 % external solution clindamycin phosphate 1 % topical solution  APPLY TO AFFECTED AREAS UNDERNEATH BREASTS AND IN GROIN DAILY.   doxycycline (VIBRAMYCIN) 100 MG capsule Take by mouth 2 (two) times daily.   famotidine  (PEPCID ) 10 MG tablet Take 10 mg by mouth daily.    Ferrous Sulfate (IRON PO) Take 65 mg by mouth daily.   fluconazole  (DIFLUCAN ) 150 MG tablet Take 1 tablet (150 mg total) by mouth every 3 (three) days.   fluticasone (FLONASE) 50 MCG/ACT nasal spray Place 1 spray into both nostrils daily.   hydrochlorothiazide  (HYDRODIURIL ) 25 MG tablet Take 1 tablet (25 mg total) by mouth daily.   loratadine (CLARITIN) 10 MG tablet Take 10 mg by mouth daily as needed.   LUTEIN PO Take by mouth daily.   Multiple Vitamin (MULTIVITAMIN) capsule Take 1 capsule by mouth daily.   Omega-3 Fatty Acids (FISH OIL PO) Take by mouth.   potassium chloride  SA (KLOR-CON  M20) 20 MEQ tablet TAKE 1 TABLET BY MOUTH TWICE A DAY   TURMERIC PO Take by mouth daily.   vitamin C (ASCORBIC ACID) 500 MG tablet Take 500 mg by mouth daily.   VITAMIN D  PO Take by mouth daily at 6 (six) AM.   VITAMIN E PO Take 400 Units by mouth daily.   No  facility-administered medications prior to visit.    Review of Systems  HENT:  Negative for hearing loss.   Eyes:  Negative for blurred vision.  Respiratory:  Negative for shortness of breath.   Cardiovascular:  Negative for chest pain.  Gastrointestinal: Negative.   Genitourinary: Negative.   Musculoskeletal:  Negative for back pain.  Neurological:  Negative for headaches.  Psychiatric/Behavioral:  Negative for depression.        Objective:     BP 110/80   Pulse 80   Temp 98.5 F (36.9 C) (Oral)   Ht 5' 3.75 (1.619 m)   Wt 201 lb 14.4 oz (91.6 kg)   LMP 04/15/2018   SpO2 98%   BMI 34.93 kg/m    Physical Exam Vitals reviewed.  Constitutional:      Appearance: Normal appearance. She is well-groomed. She is obese.  HENT:     Right  Ear: Tympanic membrane and ear canal normal.     Left Ear: Tympanic membrane and ear canal normal.     Mouth/Throat:     Mouth: Mucous membranes are moist.     Pharynx: No posterior oropharyngeal erythema.  Eyes:     Conjunctiva/sclera: Conjunctivae normal.  Neck:     Thyroid : No thyromegaly.  Cardiovascular:     Rate and Rhythm: Normal rate and regular rhythm.     Pulses: Normal pulses.     Heart sounds: S1 normal and S2 normal.  Pulmonary:     Effort: Pulmonary effort is normal.     Breath sounds: Normal breath sounds and air entry.  Abdominal:     General: Abdomen is flat. Bowel sounds are normal.     Palpations: Abdomen is soft.  Musculoskeletal:     Right lower leg: No edema.     Left lower leg: No edema.  Lymphadenopathy:     Cervical: No cervical adenopathy.  Neurological:     Mental Status: She is alert and oriented to person, place, and time. Mental status is at baseline.     Gait: Gait is intact.  Psychiatric:        Mood and Affect: Mood and affect normal.        Speech: Speech normal.        Behavior: Behavior normal.        Judgment: Judgment normal.      Results for orders placed or performed in visit on 11/15/23  POC HgB A1c  Result Value Ref Range   Hemoglobin A1C 6.1 (A) 4.0 - 5.6 %   HbA1c POC (<> result, manual entry)     HbA1c, POC (prediabetic range)     HbA1c, POC (controlled diabetic range)         Assessment & Plan:    Routine Health Maintenance and Physical Exam  Immunization History  Administered Date(s) Administered   Influenza,inj,Quad PF,6+ Mos 12/20/2016, 12/20/2017, 12/08/2018, 01/01/2020   Influenza-Unspecified 01/22/2015, 12/23/2015, 12/08/2018, 12/19/2020, 12/12/2021, 12/25/2022   Moderna SARS-COV2 Booster Vaccination 05/29/2019, 06/19/2019, 01/28/2020, 08/03/2020   PFIZER Comirnaty(Gray Top)Covid-19 Tri-Sucrose Vaccine 08/03/2020   PFIZER(Purple Top)SARS-COV-2 Vaccination 05/29/2019, 06/19/2019, 01/28/2020, 08/03/2020,  03/06/2022   Pfizer Covid-19 Vaccine Bivalent Booster 72yrs & up 01/16/2021, 09/26/2021   Tdap 11/13/2010, 11/13/2021   Unspecified SARS-COV-2 Vaccination 12/11/2022   Zoster Recombinant(Shingrix) 09/05/2019, 01/11/2020    Health Maintenance  Topic Date Due   Hepatitis B Vaccines 19-59 Average Risk (1 of 3 - 19+ 3-dose series) Never done   Pneumococcal Vaccine: 50+ Years (1 of 1 - PCV) Never done  COVID-19 Vaccine (10 - Pfizer risk 2024-25 season) 11/07/2023   Influenza Vaccine  06/05/2024 (Originally 10/07/2023)   MAMMOGRAM  09/21/2025   Cervical Cancer Screening (HPV/Pap Cotest)  01/31/2026   Colonoscopy  01/01/2029   DTaP/Tdap/Td (3 - Td or Tdap) 11/14/2031   Hepatitis C Screening  Completed   HIV Screening  Completed   Zoster Vaccines- Shingrix  Completed   HPV VACCINES  Aged Out   Meningococcal B Vaccine  Aged Out    Discussed health benefits of physical activity, and encouraged her to engage in regular exercise appropriate for her age and condition.  Lipid screening -     Lipid panel; Future  Prediabetes -     POCT glycosylated hemoglobin (Hb A1C)  Essential hypertension -     Comprehensive metabolic panel with GFR; Future  Routine general medical examination at a health care facility  General physical exam findings are normal today. I reviewed the patient's preventative testing, immunizations, and lifestyle habits. I made appropriate recommendations and placed orders for the appropriate tests and/or vaccinations. I counseled the patient on the CDC's recommendations for healthy exercise and diet. I counseled the patient on healthy sleep habits and stress management. Handouts to reinforce the counseling were given at the conclusion of the visit.    Return in 6 months (on 05/14/2024).     Heron CHRISTELLA Sharper, MD

## 2024-01-10 ENCOUNTER — Ambulatory Visit: Admitting: Family Medicine

## 2024-01-10 ENCOUNTER — Encounter: Payer: Self-pay | Admitting: Family Medicine

## 2024-01-10 VITALS — BP 132/80 | HR 78 | Temp 98.1°F | Ht 63.75 in | Wt 205.7 lb

## 2024-01-10 DIAGNOSIS — M7989 Other specified soft tissue disorders: Secondary | ICD-10-CM

## 2024-01-10 DIAGNOSIS — G8929 Other chronic pain: Secondary | ICD-10-CM

## 2024-01-10 DIAGNOSIS — M25551 Pain in right hip: Secondary | ICD-10-CM | POA: Diagnosis not present

## 2024-01-10 DIAGNOSIS — M7918 Myalgia, other site: Secondary | ICD-10-CM | POA: Diagnosis not present

## 2024-01-10 NOTE — Patient Instructions (Signed)
 800 mg ibuprofen every 8 hours for 7 full days -- take with meals or  2 tablets of the naproxen  twice a day for 7 full days -- also take with meals

## 2024-01-10 NOTE — Progress Notes (Unsigned)
 Acute Office Visit  Subjective:     Patient ID: Tanya Richard, female    DOB: 1966/02/15, 58 y.o.   MRN: [id number]  Chief Complaint  Patient presents with   Hip Pain    Patient complains of right lateral hip aching x1 year, worse lately and pain noted when walking, sleeping on the right side, no known injury   Hand Pain    Patient complains of right thumb swelling and locking up x6 months, no known injury    Hip Pain   Hand Pain   Discussed the use of AI scribe software for clinical note transcription with the patient, who gave verbal consent to proceed.  History of Present Illness   Tanya Richard is a 58 year old female who presents with right hip pain and right thumb swelling.  She experiences aching and pain in the right hip, progressively worsening over the past few months. The pain radiates down the lateral aspect of her leg, is tender to palpation, and worsens with prolonged sitting or sleeping on the right side. The pain is now constant, occurring daily. She has not experienced any recent trauma or engaged in new physical activities. She occasionally takes Aleve  and Advil, with uncertain effectiveness. There is no hip instability or sensation of giving way.  Her right thumb has been swollen since May, with limited ability to bend it. Episodes of dislocation occur, requiring manual reduction. There is no recollection of trauma to the thumb. Heat and ice provide some relief. There is a family history of gout on her father's side, affecting her aunt and cousin. No other joints are involved.        Review of Systems  All other systems reviewed and are negative.       Objective:    BP 132/80   Pulse 78   Temp 98.1 F (36.7 C) (Oral)   Ht 5' 3.75 (1.619 m)   Wt 205 lb 11.2 oz (93.3 kg)   LMP 04/15/2018   SpO2 99%   BMI 35.59 kg/m    Physical Exam Vitals reviewed.  Constitutional:      Appearance: Normal appearance. She is well-groomed and normal weight.   Cardiovascular:     Rate and Rhythm: Normal rate and regular rhythm.     Heart sounds: S1 normal and S2 normal.  Pulmonary:     Effort: Pulmonary effort is normal.     Breath sounds: Normal breath sounds and air entry.  Musculoskeletal:        General: Swelling (right thumb) present.     Right lower leg: No edema.     Left lower leg: No edema.  Neurological:     Mental Status: She is alert and oriented to person, place, and time. Mental status is at baseline.     Gait: Gait is intact.  Psychiatric:        Mood and Affect: Mood and affect normal.        Speech: Speech normal.        Behavior: Behavior normal.     No results found for any visits on 01/10/24.      Assessment & Plan:   Problem List Items Addressed This Visit   None Visit Diagnoses       Chronic right hip pain    -  Primary   Relevant Orders   DG Hip Unilat W OR W/O Pelvis 2-3 Views Right     Right buttock pain  Relevant Orders   DG Sacrum/Coccyx     Swelling of thumb, right       Relevant Orders   Uric acid   Rheumatoid factor   DG Finger Thumb Right      Assessment and Plan    Right hip and lateral buttock pain with possible bursitis or early arthritis Chronic right hip and lateral buttock pain, progressively worsening over the past few weeks. Pain is aching, radiates down the leg, and is exacerbated by prolonged sitting or lying on the affected side. Differential diagnosis includes bursitis, early arthritis, and sacroiliac joint dysfunction. No recent trauma or new exercise regimen. No instability or giving out of the hip. X-rays are necessary to rule out serious conditions such as avascular necrosis or lytic bone lesions. - Ordered x-rays of the right hip and pelvis to evaluate for arthritis, bony abnormalities, and sacroiliac joint issues. - Recommended a 7-day course of ibuprofen 800 mg every 8 hours or naproxen  500 mg twice daily with food to reduce inflammation. - Advised follow-up after 7  days to assess pain relief and consider further treatment options such as a Medrol  Dosepak or referral to sports medicine if no improvement.  Right thumb swelling and limited motion, possible inflammatory arthritis Chronic right thumb swelling and limited motion since May, with episodes of locking and difficulty gripping. No trauma or specific inciting event. Differential diagnosis includes gout and rheumatoid arthritis. Family history of gout on the paternal side. Swelling has decreased but persists with limited motion. Heat provides relief, while ice is less effective. - Ordered blood tests for rheumatoid factor and uric acid level to rule out rheumatoid arthritis and gout. - Ordered x-ray of the right thumb to assess for joint abnormalities. - Recommended a 7-day course of ibuprofen or naproxen  as outlined for hip pain to reduce inflammation. - Will consider referral to a hand specialist or rheumatologist if blood tests are inconclusive or if no improvement is noted.       No orders of the defined types were placed in this encounter.   No follow-ups on file.  Heron CHRISTELLA Sharper, MD

## 2024-01-13 ENCOUNTER — Other Ambulatory Visit (INDEPENDENT_AMBULATORY_CARE_PROVIDER_SITE_OTHER)

## 2024-01-13 ENCOUNTER — Ambulatory Visit (INDEPENDENT_AMBULATORY_CARE_PROVIDER_SITE_OTHER)

## 2024-01-13 DIAGNOSIS — G8929 Other chronic pain: Secondary | ICD-10-CM

## 2024-01-13 DIAGNOSIS — M7918 Myalgia, other site: Secondary | ICD-10-CM | POA: Diagnosis not present

## 2024-01-13 DIAGNOSIS — Z13220 Encounter for screening for lipoid disorders: Secondary | ICD-10-CM

## 2024-01-13 DIAGNOSIS — M25551 Pain in right hip: Secondary | ICD-10-CM

## 2024-01-13 DIAGNOSIS — M7989 Other specified soft tissue disorders: Secondary | ICD-10-CM

## 2024-01-13 LAB — LIPID PANEL
Cholesterol: 173 mg/dL (ref 0–200)
HDL: 68.1 mg/dL (ref >39.00)
LDL Cholesterol: 93 mg/dL (ref 0–99)
NonHDL: 104.81
Total CHOL/HDL Ratio: 3
Triglycerides: 58 mg/dL (ref 0.0–149.0)
VLDL: 11.6 mg/dL (ref 0.0–40.0)

## 2024-01-13 LAB — URIC ACID: Uric Acid, Serum: 7.3 mg/dL — ABNORMAL HIGH (ref 2.4–7.0)

## 2024-01-14 LAB — RHEUMATOID FACTOR: Rheumatoid fact SerPl-aCnc: <10 [IU]/mL (ref <14)

## 2024-01-18 ENCOUNTER — Ambulatory Visit: Payer: Self-pay | Admitting: Family Medicine

## 2024-01-28 ENCOUNTER — Other Ambulatory Visit: Payer: Self-pay | Admitting: Family Medicine

## 2024-01-28 DIAGNOSIS — I10 Essential (primary) hypertension: Secondary | ICD-10-CM

## 2024-02-16 ENCOUNTER — Encounter: Payer: Self-pay | Admitting: Nurse Practitioner

## 2024-02-16 ENCOUNTER — Ambulatory Visit (INDEPENDENT_AMBULATORY_CARE_PROVIDER_SITE_OTHER): Admitting: Nurse Practitioner

## 2024-02-16 ENCOUNTER — Other Ambulatory Visit (HOSPITAL_COMMUNITY)
Admission: RE | Admit: 2024-02-16 | Discharge: 2024-02-16 | Disposition: A | Discharge status: Home / Self Care | Source: Ambulatory Visit | Attending: Nurse Practitioner | Admitting: Nurse Practitioner

## 2024-02-16 VITALS — BP 130/80 | HR 82 | Ht 63.5 in | Wt 208.0 lb

## 2024-02-16 DIAGNOSIS — Z124 Encounter for screening for malignant neoplasm of cervix: Secondary | ICD-10-CM

## 2024-02-16 DIAGNOSIS — Z780 Asymptomatic menopausal state: Secondary | ICD-10-CM | POA: Diagnosis not present

## 2024-02-16 DIAGNOSIS — Z01419 Encounter for gynecological examination (general) (routine) without abnormal findings: Secondary | ICD-10-CM | POA: Diagnosis not present

## 2024-02-16 DIAGNOSIS — Z1331 Encounter for screening for depression: Secondary | ICD-10-CM | POA: Diagnosis not present

## 2024-02-16 NOTE — Progress Notes (Signed)
° °  Tanya Richard 04-16-1965 [id number]   History:  58 y.o. G0 presents for annual exam without GYN complaints. Postmenopausal - no HRT, no bleeding. 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 HTN managed by PCP.   Gynecologic History No LMP recorded. (Menstrual status: Postmenopausal).   Contraception: post menopausal status Sexually active: No  Health Maintenance Last Pap: 02/01/2023 Results were: Normal Last mammogram: 09/22/2023. Results were: Normal Last colonoscopy: 01/02/2019. Results were: Normal, 10-year recall Last Dexa: Not indicated     02/16/2024    3:33 PM  Depression screen PHQ 2/9  Decreased Interest 0  Down, Depressed, Hopeless 0  PHQ - 2 Score 0     Past medical history, past surgical history, family history and social history were all reviewed and documented in the EPIC chart. Single. Works for [employer].   ROS:  A ROS was performed and pertinent positives and negatives are included.  Exam:  Vitals:   02/16/24 1528  BP: 130/80  Pulse: 82  SpO2: 99%  Weight: 208 lb (94.3 kg)  Height: 5' 3.5 (1.613 m)       Body mass index is 36.27 kg/m.  General appearance:  Normal Thyroid :  Symmetrical, normal in size, without palpable masses or nodularity. Respiratory  Auscultation:  Clear without wheezing or rhonchi Cardiovascular  Auscultation:  Regular rate, without rubs, murmurs or gallops  Edema/varicosities:  Not grossly evident Abdominal  Soft,nontender, without masses, guarding or rebound.  Liver/spleen:  No organomegaly noted  Hernia:  None appreciated  Skin  Inspection:  Grossly normal   Breasts: Examined lying and sitting.   Right: Without masses, retractions, discharge or axillary adenopathy.   Left: Without masses, retractions, discharge or axillary adenopathy. Pelvic: External genitalia:  no lesions              Urethra:  normal appearing urethra with no masses, tenderness or lesions              Bartholins and Skenes: normal                  Vagina: normal appearing vagina with normal color and discharge, no lesions              Cervix: no lesions Bimanual Exam:  Uterus:  no masses or tenderness              Adnexa: no mass, fullness, tenderness              Rectovaginal: Deferred              Anus:  normal, no lesions  Tanya Richard, CMA present as chaperone.   Assessment/Plan:  58 y.o. G0 for annual exam.   Well female exam with routine gynecological exam - Education provided on SBEs, importance of preventative screenings, current guidelines, high calcium diet, regular exercise, and multivitamin daily. Labs with PCP.   Postmenopausal - no HRT, no bleeding  Depression screening - PHQ - 0  Cervical cancer screening - Plan: Cytology - PAP( [facility]). 1986 CIN-1 with cryo, subsequent paps normal. Discussed current guidelines. Requests annual paps.   Screening for breast cancer - Normal mammogram history. Normal breast exam today. Continue annual screenings.   Screening for colon cancer - Normal colonoscopy in 2020.   Return in about 1 year (around 02/15/2025) for Annual.      Tanya Richard Tanya Richard [facility], 3:44 PM 02/16/2024

## 2024-02-17 LAB — CYTOLOGY - PAP
Adequacy: ABSENT
Diagnosis: NEGATIVE

## 2024-02-19 ENCOUNTER — Ambulatory Visit: Payer: Self-pay | Admitting: Nurse Practitioner
